# Patient Record
Sex: Female | Born: 1960 | Race: White | Hispanic: No | Marital: Single | Smoking: Former smoker
Health system: Southern US, Community
[De-identification: ages and names within clinical notes are randomized; demographics above are authoritative.]

## PROBLEM LIST (undated history)

## (undated) DIAGNOSIS — G4700 Insomnia, unspecified: Secondary | ICD-10-CM

## (undated) DIAGNOSIS — F419 Anxiety disorder, unspecified: Secondary | ICD-10-CM

## (undated) DIAGNOSIS — I10 Essential (primary) hypertension: Secondary | ICD-10-CM

## (undated) DIAGNOSIS — T148XXA Other injury of unspecified body region, initial encounter: Secondary | ICD-10-CM

## (undated) HISTORY — PX: OTHER SURGICAL HISTORY: SHX169

---

## 2004-09-24 ENCOUNTER — Ambulatory Visit: Payer: Self-pay | Admitting: Internal Medicine

## 2004-09-29 ENCOUNTER — Ambulatory Visit: Payer: Self-pay | Admitting: Internal Medicine

## 2004-10-25 ENCOUNTER — Ambulatory Visit: Payer: Self-pay | Admitting: Internal Medicine

## 2006-06-15 ENCOUNTER — Ambulatory Visit: Payer: Self-pay | Admitting: Family Medicine

## 2011-08-01 ENCOUNTER — Ambulatory Visit: Payer: Self-pay | Admitting: Orthopaedic Surgery

## 2011-08-01 ENCOUNTER — Inpatient Hospital Stay: Payer: Self-pay | Admitting: Internal Medicine

## 2011-08-01 LAB — URINALYSIS, COMPLETE
Bilirubin,UR: NEGATIVE
Glucose,UR: NEGATIVE mg/dL (ref 0–75)
Hyaline Cast: 6
Ketone: NEGATIVE
Nitrite: NEGATIVE
Protein: NEGATIVE
Specific Gravity: 1.013 (ref 1.003–1.030)
Squamous Epithelial: 1

## 2011-08-01 LAB — CBC
HGB: 12.7 g/dL (ref 12.0–16.0)
MCH: 35.3 pg — ABNORMAL HIGH (ref 26.0–34.0)
MCHC: 34.1 g/dL (ref 32.0–36.0)
MCV: 104 fL — ABNORMAL HIGH (ref 80–100)
Platelet: 107 10*3/uL — ABNORMAL LOW (ref 150–440)
RBC: 3.59 10*6/uL — ABNORMAL LOW (ref 3.80–5.20)
WBC: 30.4 10*3/uL — ABNORMAL HIGH (ref 3.6–11.0)

## 2011-08-01 LAB — COMPREHENSIVE METABOLIC PANEL
Albumin: 1.5 g/dL — ABNORMAL LOW (ref 3.4–5.0)
Anion Gap: 13 (ref 7–16)
BUN: 148 mg/dL — ABNORMAL HIGH (ref 7–18)
Bilirubin,Total: 1.3 mg/dL — ABNORMAL HIGH (ref 0.2–1.0)
Calcium, Total: 8.5 mg/dL (ref 8.5–10.1)
Chloride: 101 mmol/L (ref 98–107)
Co2: 27 mmol/L (ref 21–32)
Creatinine: 1.83 mg/dL — ABNORMAL HIGH (ref 0.60–1.30)
EGFR (African American): 37 — ABNORMAL LOW
EGFR (Non-African Amer.): 31 — ABNORMAL LOW
Glucose: 229 mg/dL — ABNORMAL HIGH (ref 65–99)
Osmolality: 337 (ref 275–301)
SGOT(AST): 76 U/L — ABNORMAL HIGH (ref 15–37)
Sodium: 141 mmol/L (ref 136–145)
Total Protein: 6.6 g/dL (ref 6.4–8.2)

## 2011-08-01 LAB — DRUG SCREEN, URINE
Amphetamines, Ur Screen: NEGATIVE (ref ?–1000)
Barbiturates, Ur Screen: NEGATIVE (ref ?–200)
Benzodiazepine, Ur Scrn: NEGATIVE (ref ?–200)
MDMA (Ecstasy)Ur Screen: NEGATIVE (ref ?–500)
Methadone, Ur Screen: NEGATIVE (ref ?–300)
Opiate, Ur Screen: NEGATIVE (ref ?–300)
Phencyclidine (PCP) Ur S: NEGATIVE (ref ?–25)
Tricyclic, Ur Screen: NEGATIVE (ref ?–1000)

## 2011-08-01 LAB — CK TOTAL AND CKMB (NOT AT ARMC): CK, Total: 301 U/L — ABNORMAL HIGH (ref 21–215)

## 2011-08-02 LAB — CBC WITH DIFFERENTIAL/PLATELET
Eosinophil #: 0 10*3/uL (ref 0.0–0.7)
Eosinophil %: 0 %
HCT: 34.6 % — ABNORMAL LOW (ref 35.0–47.0)
HGB: 11.7 g/dL — ABNORMAL LOW (ref 12.0–16.0)
Lymphocyte #: 0.6 10*3/uL — ABNORMAL LOW (ref 1.0–3.6)
MCH: 35.5 pg — ABNORMAL HIGH (ref 26.0–34.0)
Monocyte #: 0.1 10*3/uL (ref 0.0–0.7)
Monocyte %: 0.5 %
Monocytes: 4 %
Neutrophil #: 26 10*3/uL — ABNORMAL HIGH (ref 1.4–6.5)
Platelet: 92 10*3/uL — ABNORMAL LOW (ref 150–440)
RBC: 3.31 10*6/uL — ABNORMAL LOW (ref 3.80–5.20)
Segmented Neutrophils: 70 %
WBC: 26.7 10*3/uL — ABNORMAL HIGH (ref 3.6–11.0)

## 2011-08-02 LAB — LIPID PANEL
Cholesterol: 118 mg/dL (ref 0–200)
HDL Cholesterol: 8 mg/dL — ABNORMAL LOW (ref 40–60)
Triglycerides: 300 mg/dL — ABNORMAL HIGH (ref 0–200)

## 2011-08-02 LAB — BASIC METABOLIC PANEL
Anion Gap: 12 (ref 7–16)
Anion Gap: 11 (ref 7–16)
BUN: 143 mg/dL — ABNORMAL HIGH (ref 7–18)
Chloride: 109 mmol/L — ABNORMAL HIGH (ref 98–107)
Co2: 26 mmol/L (ref 21–32)
Co2: 25 mmol/L (ref 21–32)
Creatinine: 1.69 mg/dL — ABNORMAL HIGH (ref 0.60–1.30)
EGFR (African American): 39 — ABNORMAL LOW
EGFR (African American): 41 — ABNORMAL LOW
EGFR (Non-African Amer.): 32 — ABNORMAL LOW
Glucose: 137 mg/dL — ABNORMAL HIGH (ref 65–99)
Osmolality: 330 (ref 275–301)
Potassium: 3.4 mmol/L — ABNORMAL LOW (ref 3.5–5.1)
Sodium: 147 mmol/L — ABNORMAL HIGH (ref 136–145)

## 2011-08-02 LAB — CK: CK, Total: 180 U/L (ref 21–215)

## 2011-08-02 LAB — MAGNESIUM: Magnesium: 3 mg/dL — ABNORMAL HIGH

## 2011-08-03 LAB — CBC WITH DIFFERENTIAL/PLATELET
Comment - H1-Com1: NORMAL
Comment - H1-Com2: NORMAL
Eosinophil #: 0 10*3/uL (ref 0.0–0.7)
Eosinophil %: 0 %
HCT: 24.1 % — ABNORMAL LOW (ref 35.0–47.0)
Lymphocytes: 2 %
MCHC: 34.3 g/dL (ref 32.0–36.0)
MCV: 104 fL — ABNORMAL HIGH (ref 80–100)
Monocyte #: 0.4 10*3/uL (ref 0.0–0.7)
Neutrophil #: 30.8 10*3/uL — ABNORMAL HIGH (ref 1.4–6.5)
Neutrophil %: 97.4 %
Platelet: 107 10*3/uL — ABNORMAL LOW (ref 150–440)
RBC: 2.31 10*6/uL — ABNORMAL LOW (ref 3.80–5.20)
Segmented Neutrophils: 87 %

## 2011-08-03 LAB — URINE CULTURE

## 2011-08-03 LAB — COMPREHENSIVE METABOLIC PANEL
Albumin: 1.2 g/dL — ABNORMAL LOW (ref 3.4–5.0)
Alkaline Phosphatase: 124 U/L (ref 50–136)
Anion Gap: 14 (ref 7–16)
BUN: 95 mg/dL — ABNORMAL HIGH (ref 7–18)
Bilirubin,Total: 0.9 mg/dL (ref 0.2–1.0)
Calcium, Total: 6.9 mg/dL — CL (ref 8.5–10.1)
Chloride: 109 mmol/L — ABNORMAL HIGH (ref 98–107)
Co2: 22 mmol/L (ref 21–32)
EGFR (African American): 49 — ABNORMAL LOW
Glucose: 140 mg/dL — ABNORMAL HIGH (ref 65–99)
Potassium: 4.2 mmol/L (ref 3.5–5.1)
SGOT(AST): 108 U/L — ABNORMAL HIGH (ref 15–37)
Sodium: 145 mmol/L (ref 136–145)
Total Protein: 5 g/dL — ABNORMAL LOW (ref 6.4–8.2)

## 2011-08-04 LAB — COMPREHENSIVE METABOLIC PANEL
Albumin: 1.2 g/dL — ABNORMAL LOW (ref 3.4–5.0)
Alkaline Phosphatase: 117 U/L (ref 50–136)
BUN: 48 mg/dL — ABNORMAL HIGH (ref 7–18)
Calcium, Total: 6.8 mg/dL — CL (ref 8.5–10.1)
Creatinine: 0.97 mg/dL (ref 0.60–1.30)
Glucose: 226 mg/dL — ABNORMAL HIGH (ref 65–99)
Osmolality: 316 (ref 275–301)
SGPT (ALT): 19 U/L
Sodium: 149 mmol/L — ABNORMAL HIGH (ref 136–145)
Total Protein: 5.4 g/dL — ABNORMAL LOW (ref 6.4–8.2)

## 2011-08-04 LAB — CBC WITH DIFFERENTIAL/PLATELET
Basophil #: 0 10*3/uL (ref 0.0–0.1)
Basophil %: 0 %
Eosinophil %: 0 %
HCT: 22.5 % — ABNORMAL LOW (ref 35.0–47.0)
HGB: 7.6 g/dL — ABNORMAL LOW (ref 12.0–16.0)
MCH: 35.3 pg — ABNORMAL HIGH (ref 26.0–34.0)
MCV: 104 fL — ABNORMAL HIGH (ref 80–100)
Platelet: 155 10*3/uL (ref 150–440)
RBC: 2.16 10*6/uL — ABNORMAL LOW (ref 3.80–5.20)
RDW: 15.2 % — ABNORMAL HIGH (ref 11.5–14.5)
WBC: 22 10*3/uL — ABNORMAL HIGH (ref 3.6–11.0)

## 2011-08-04 LAB — HEMOGLOBIN A1C

## 2011-08-04 LAB — OCCULT BLOOD X 1 CARD TO LAB, STOOL: Occult Blood, Feces: POSITIVE

## 2011-08-05 LAB — CBC WITH DIFFERENTIAL/PLATELET
Eosinophil #: 0 10*3/uL (ref 0.0–0.7)
HCT: 22.4 % — ABNORMAL LOW (ref 35.0–47.0)
HGB: 7.6 g/dL — ABNORMAL LOW (ref 12.0–16.0)
MCHC: 33.8 g/dL (ref 32.0–36.0)
MCV: 104 fL — ABNORMAL HIGH (ref 80–100)
Monocyte #: 0.4 x10 3/mm (ref 0.2–0.9)
Neutrophil %: 94.9 %
Platelet: 235 10*3/uL (ref 150–440)
RBC: 2.14 10*6/uL — ABNORMAL LOW (ref 3.80–5.20)
WBC: 26.5 10*3/uL — ABNORMAL HIGH (ref 3.6–11.0)

## 2011-08-05 LAB — FERRITIN: Ferritin (ARMC): 642 ng/mL — ABNORMAL HIGH (ref 8–388)

## 2011-08-05 LAB — HEMOGLOBIN A1C

## 2011-08-06 LAB — CBC WITH DIFFERENTIAL/PLATELET
Basophil #: 0 10*3/uL (ref 0.0–0.1)
Eosinophil #: 0.1 10*3/uL (ref 0.0–0.7)
HCT: 19.3 % — ABNORMAL LOW (ref 35.0–47.0)
HGB: 6.5 g/dL — ABNORMAL LOW (ref 12.0–16.0)
Lymphocyte #: 1 10*3/uL (ref 1.0–3.6)
Lymphocyte %: 3 %
MCHC: 33.7 g/dL (ref 32.0–36.0)
Monocyte #: 0.5 x10 3/mm (ref 0.2–0.9)
RDW: 14.5 % (ref 11.5–14.5)

## 2011-08-06 LAB — WOUND CULTURE

## 2011-08-06 LAB — IRON AND TIBC
Iron Bind.Cap.(Total): 209 ug/dL — ABNORMAL LOW (ref 250–450)
Iron: 42 ug/dL — ABNORMAL LOW (ref 50–170)
Unbound Iron-Bind.Cap.: 167 ug/dL

## 2011-08-07 LAB — BASIC METABOLIC PANEL
BUN: 10 mg/dL (ref 7–18)
Chloride: 104 mmol/L (ref 98–107)
Co2: 25 mmol/L (ref 21–32)
EGFR (Non-African Amer.): >60
Osmolality: 278 (ref 275–301)
Potassium: 3.6 mmol/L (ref 3.5–5.1)

## 2011-08-07 LAB — CBC WITH DIFFERENTIAL/PLATELET
Basophil #: 0 10*3/uL (ref 0.0–0.1)
Eosinophil %: 0.1 %
HGB: 10 g/dL — ABNORMAL LOW (ref 12.0–16.0)
Lymphocyte %: 3.3 %
MCH: 31.4 pg (ref 26.0–34.0)
MCHC: 32.2 g/dL (ref 32.0–36.0)
Monocyte #: 0.9 x10 3/mm (ref 0.2–0.9)
Neutrophil #: 30.7 10*3/uL — ABNORMAL HIGH (ref 1.4–6.5)
Neutrophil %: 93.8 %
Platelet: 419 10*3/uL (ref 150–440)
RBC: 3.19 10*6/uL — ABNORMAL LOW (ref 3.80–5.20)
RDW: 18.2 % — ABNORMAL HIGH (ref 11.5–14.5)

## 2011-08-08 LAB — CBC WITH DIFFERENTIAL/PLATELET
Basophil %: 0.3 %
Eosinophil #: 0 10*3/uL (ref 0.0–0.7)
Eosinophil %: 0.1 %
HCT: 23.7 % — ABNORMAL LOW (ref 35.0–47.0)
Lymphocyte #: 0.9 10*3/uL — ABNORMAL LOW (ref 1.0–3.6)
MCH: 31.5 pg (ref 26.0–34.0)
Monocyte #: 0.5 x10 3/mm (ref 0.2–0.9)
Neutrophil %: 93.3 %
Platelet: 396 10*3/uL (ref 150–440)
RBC: 2.4 10*6/uL — ABNORMAL LOW (ref 3.80–5.20)
WBC: 21.1 10*3/uL — ABNORMAL HIGH (ref 3.6–11.0)

## 2011-08-09 LAB — BASIC METABOLIC PANEL
Anion Gap: 12 (ref 7–16)
BUN: 8 mg/dL (ref 7–18)
Calcium, Total: 6.6 mg/dL — CL (ref 8.5–10.1)
Chloride: 106 mmol/L (ref 98–107)
Creatinine: 0.72 mg/dL (ref 0.60–1.30)
EGFR (African American): >60
EGFR (Non-African Amer.): >60
Glucose: 74 mg/dL (ref 65–99)
Osmolality: 276 (ref 275–301)
Sodium: 140 mmol/L (ref 136–145)

## 2011-08-09 LAB — CBC WITH DIFFERENTIAL/PLATELET
Basophil #: 0.1 10*3/uL (ref 0.0–0.1)
Eosinophil %: 0.1 %
HGB: 10.5 g/dL — ABNORMAL LOW (ref 12.0–16.0)
Lymphocyte #: 1.2 10*3/uL (ref 1.0–3.6)
Lymphocyte %: 4.6 %
MCH: 31.2 pg (ref 26.0–34.0)
MCHC: 32.2 g/dL (ref 32.0–36.0)
Monocyte #: 0.9 x10 3/mm (ref 0.2–0.9)
Neutrophil %: 91.7 %
RBC: 3.36 10*6/uL — ABNORMAL LOW (ref 3.80–5.20)

## 2011-08-10 LAB — CBC WITH DIFFERENTIAL/PLATELET
Basophil #: 0.1 10*3/uL (ref 0.0–0.1)
Basophil %: 0.7 %
Eosinophil #: 0 10*3/uL (ref 0.0–0.7)
Eosinophil %: 0.1 %
HCT: 29.4 % — ABNORMAL LOW (ref 35.0–47.0)
HGB: 9.4 g/dL — ABNORMAL LOW (ref 12.0–16.0)
Lymphocyte #: 1 10*3/uL (ref 1.0–3.6)
Lymphocyte %: 5.1 %
MCH: 30.7 pg (ref 26.0–34.0)
Monocyte #: 0.7 x10 3/mm (ref 0.2–0.9)
Neutrophil #: 17.2 10*3/uL — ABNORMAL HIGH (ref 1.4–6.5)
Neutrophil %: 90.6 %
RBC: 3.07 10*6/uL — ABNORMAL LOW (ref 3.80–5.20)
RDW: 16.3 % — ABNORMAL HIGH (ref 11.5–14.5)
WBC: 19 10*3/uL — ABNORMAL HIGH (ref 3.6–11.0)

## 2011-08-11 LAB — CBC WITH DIFFERENTIAL/PLATELET
Eosinophil %: 0.2 %
HGB: 9.7 g/dL — ABNORMAL LOW (ref 12.0–16.0)
Lymphocyte #: 1.2 10*3/uL (ref 1.0–3.6)
Lymphocyte %: 7.2 %
MCHC: 32.1 g/dL (ref 32.0–36.0)
MCV: 97 fL (ref 80–100)
Monocyte %: 4.9 %
Neutrophil #: 14.5 10*3/uL — ABNORMAL HIGH (ref 1.4–6.5)
Neutrophil %: 86.7 %
WBC: 16.8 10*3/uL — ABNORMAL HIGH (ref 3.6–11.0)

## 2011-08-11 LAB — POTASSIUM: Potassium: 3.2 mmol/L — ABNORMAL LOW (ref 3.5–5.1)

## 2011-08-11 LAB — WOUND CULTURE

## 2011-08-12 LAB — CBC WITH DIFFERENTIAL/PLATELET
Basophil #: 0.2 10*3/uL — ABNORMAL HIGH (ref 0.0–0.1)
Basophil %: 1.1 %
Eosinophil %: 0.4 %
Lymphocyte #: 1.2 10*3/uL (ref 1.0–3.6)
Lymphocyte %: 7.7 %
MCH: 30.4 pg (ref 26.0–34.0)
Monocyte #: 1 x10 3/mm — ABNORMAL HIGH (ref 0.2–0.9)
Monocyte %: 6.3 %
Neutrophil #: 12.7 10*3/uL — ABNORMAL HIGH (ref 1.4–6.5)
Neutrophil %: 84.5 %
Platelet: 625 10*3/uL — ABNORMAL HIGH (ref 150–440)
RBC: 3.57 10*6/uL — ABNORMAL LOW (ref 3.80–5.20)
RDW: 16.9 % — ABNORMAL HIGH (ref 11.5–14.5)
WBC: 15 10*3/uL — ABNORMAL HIGH (ref 3.6–11.0)

## 2011-08-13 LAB — CBC WITH DIFFERENTIAL/PLATELET
Basophil %: 0.6 %
HCT: 29.2 % — ABNORMAL LOW (ref 35.0–47.0)
HGB: 9.6 g/dL — ABNORMAL LOW (ref 12.0–16.0)
Lymphocyte #: 1.5 10*3/uL (ref 1.0–3.6)
MCHC: 32.7 g/dL (ref 32.0–36.0)
MCV: 96 fL (ref 80–100)
Monocyte %: 7.4 %
Neutrophil #: 11 10*3/uL — ABNORMAL HIGH (ref 1.4–6.5)
Neutrophil %: 80.2 %
Platelet: 520 10*3/uL — ABNORMAL HIGH (ref 150–440)
RDW: 17.1 % — ABNORMAL HIGH (ref 11.5–14.5)
WBC: 13.8 10*3/uL — ABNORMAL HIGH (ref 3.6–11.0)

## 2011-08-13 LAB — CREATININE, SERUM
Creatinine: 0.65 mg/dL (ref 0.60–1.30)
EGFR (African American): >60
EGFR (Non-African Amer.): >60

## 2011-08-13 LAB — CULTURE, BLOOD (SINGLE)

## 2011-08-14 LAB — BASIC METABOLIC PANEL
BUN: 6 mg/dL — ABNORMAL LOW (ref 7–18)
Calcium, Total: 7.2 mg/dL — ABNORMAL LOW (ref 8.5–10.1)
Chloride: 106 mmol/L (ref 98–107)
Co2: 26 mmol/L (ref 21–32)
Creatinine: 0.6 mg/dL (ref 0.60–1.30)
EGFR (African American): >60
Potassium: 3.6 mmol/L (ref 3.5–5.1)

## 2011-08-14 LAB — CBC WITH DIFFERENTIAL/PLATELET
Basophil #: 0.1 10*3/uL (ref 0.0–0.1)
Basophil %: 0.6 %
HCT: 25.5 % — ABNORMAL LOW (ref 35.0–47.0)
HGB: 8.1 g/dL — ABNORMAL LOW (ref 12.0–16.0)
Lymphocyte %: 10.5 %
MCV: 95 fL (ref 80–100)
Monocyte %: 5.5 %
Neutrophil %: 81.8 %
Platelet: 401 10*3/uL (ref 150–440)
RBC: 2.68 10*6/uL — ABNORMAL LOW (ref 3.80–5.20)
RDW: 17.2 % — ABNORMAL HIGH (ref 11.5–14.5)

## 2011-08-14 LAB — WOUND CULTURE

## 2011-08-15 LAB — CBC WITH DIFFERENTIAL/PLATELET
Eosinophil #: 0.2 10*3/uL (ref 0.0–0.7)
Eosinophil %: 1.4 %
HCT: 27.7 % — ABNORMAL LOW (ref 35.0–47.0)
HGB: 8.9 g/dL — ABNORMAL LOW (ref 12.0–16.0)
Lymphocyte #: 1.3 10*3/uL (ref 1.0–3.6)
MCH: 30.7 pg (ref 26.0–34.0)
MCHC: 32.2 g/dL (ref 32.0–36.0)
Monocyte #: 0.9 x10 3/mm (ref 0.2–0.9)
Neutrophil #: 9.1 10*3/uL — ABNORMAL HIGH (ref 1.4–6.5)
Neutrophil %: 78.6 %
Platelet: 377 10*3/uL (ref 150–440)
RBC: 2.91 10*6/uL — ABNORMAL LOW (ref 3.80–5.20)
RDW: 17.3 % — ABNORMAL HIGH (ref 11.5–14.5)

## 2011-08-17 LAB — CBC WITH DIFFERENTIAL/PLATELET
Basophil #: 0 10*3/uL (ref 0.0–0.1)
Basophil %: 0.4 %
Eosinophil #: 0.3 10*3/uL (ref 0.0–0.7)
HCT: 25.8 % — ABNORMAL LOW (ref 35.0–47.0)
Lymphocyte %: 16.8 %
MCH: 30.4 pg (ref 26.0–34.0)
MCHC: 32.6 g/dL (ref 32.0–36.0)
Monocyte #: 1.3 x10 3/mm — ABNORMAL HIGH (ref 0.2–0.9)
Neutrophil #: 8.9 10*3/uL — ABNORMAL HIGH (ref 1.4–6.5)
Neutrophil %: 70.4 %
Platelet: 376 10*3/uL (ref 150–440)
RBC: 2.77 10*6/uL — ABNORMAL LOW (ref 3.80–5.20)

## 2011-08-17 LAB — WOUND CULTURE

## 2011-08-17 LAB — BASIC METABOLIC PANEL
Anion Gap: 8 (ref 7–16)
BUN: 5 mg/dL — ABNORMAL LOW (ref 7–18)
Chloride: 103 mmol/L (ref 98–107)
Co2: 26 mmol/L (ref 21–32)
Creatinine: 0.59 mg/dL — ABNORMAL LOW (ref 0.60–1.30)
EGFR (African American): >60
EGFR (Non-African Amer.): >60
Glucose: 99 mg/dL (ref 65–99)
Potassium: 3.5 mmol/L (ref 3.5–5.1)

## 2011-08-19 LAB — CBC WITH DIFFERENTIAL/PLATELET
Basophil #: 0.1 10*3/uL (ref 0.0–0.1)
Basophil %: 0.3 %
Eosinophil %: 2.6 %
HCT: 28.7 % — ABNORMAL LOW (ref 35.0–47.0)
HGB: 9.2 g/dL — ABNORMAL LOW (ref 12.0–16.0)
Lymphocyte #: 2.5 10*3/uL (ref 1.0–3.6)
Lymphocyte %: 13.2 %
MCH: 30.1 pg (ref 26.0–34.0)
MCHC: 32.2 g/dL (ref 32.0–36.0)
Monocyte #: 1.6 x10 3/mm — ABNORMAL HIGH (ref 0.2–0.9)
Monocyte %: 8.4 %
Neutrophil #: 14.1 10*3/uL — ABNORMAL HIGH (ref 1.4–6.5)
Neutrophil %: 75.5 %
RDW: 17.7 % — ABNORMAL HIGH (ref 11.5–14.5)
WBC: 18.7 10*3/uL — ABNORMAL HIGH (ref 3.6–11.0)

## 2011-08-20 LAB — CBC WITH DIFFERENTIAL/PLATELET
Basophil #: 0 10*3/uL (ref 0.0–0.1)
Basophil %: 0.2 %
Eosinophil #: 0.5 10*3/uL (ref 0.0–0.7)
Eosinophil %: 2.4 %
Lymphocyte %: 13.7 %
MCH: 30.3 pg (ref 26.0–34.0)
MCHC: 32.7 g/dL (ref 32.0–36.0)
Monocyte #: 1.4 x10 3/mm — ABNORMAL HIGH (ref 0.2–0.9)
Neutrophil #: 14.9 10*3/uL — ABNORMAL HIGH (ref 1.4–6.5)
Neutrophil %: 76.5 %
Platelet: 531 10*3/uL — ABNORMAL HIGH (ref 150–440)
RBC: 2.71 10*6/uL — ABNORMAL LOW (ref 3.80–5.20)
RDW: 17.6 % — ABNORMAL HIGH (ref 11.5–14.5)

## 2011-08-23 LAB — CULTURE, FUNGUS WITHOUT SMEAR

## 2011-10-06 ENCOUNTER — Encounter: Payer: Self-pay | Admitting: Internal Medicine

## 2011-10-26 ENCOUNTER — Encounter: Payer: Self-pay | Admitting: Internal Medicine

## 2011-11-26 ENCOUNTER — Encounter: Payer: Self-pay | Admitting: Internal Medicine

## 2012-07-28 ENCOUNTER — Ambulatory Visit: Payer: Self-pay

## 2013-08-13 IMAGING — CR DG HAND COMPLETE 3+V*L*
1 series · 3 of 3 positions shown · non-contrast
Comparison: none

REASON FOR EXAM: pain
COMMENTS:

PROCEDURE:     DXR - DXR HAND LT COMPLETE  W/OBLIQUES  - August 01, 2011  [DATE]
RESULT:     Comparison: None.

[Series 1: pa · 0.17mm/px · 3 of 3 slices shown]
[im 1/3]
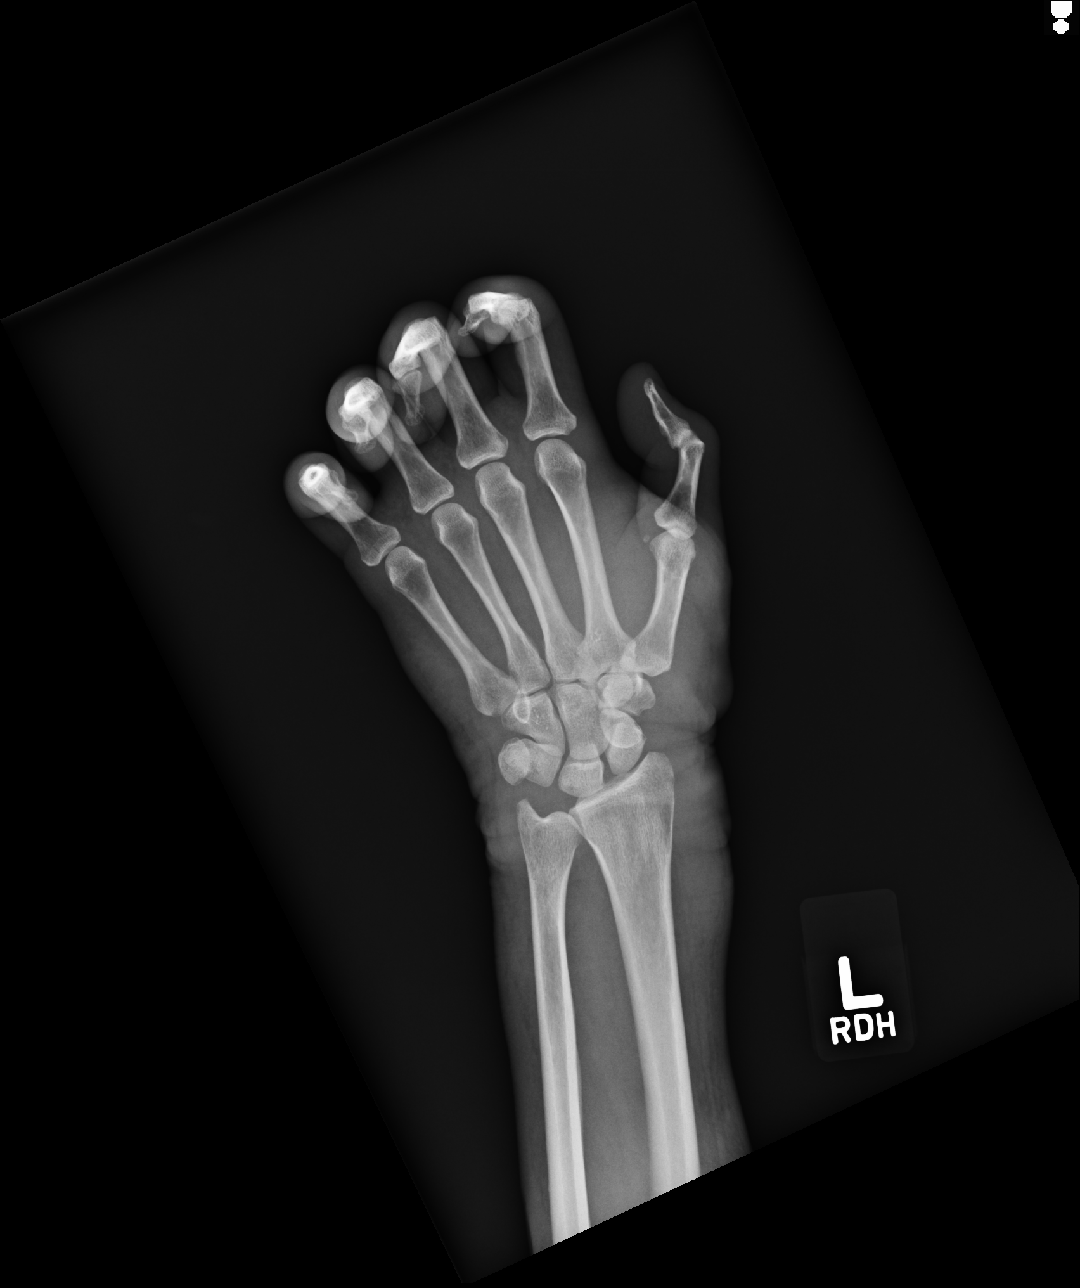
[im 2/3]
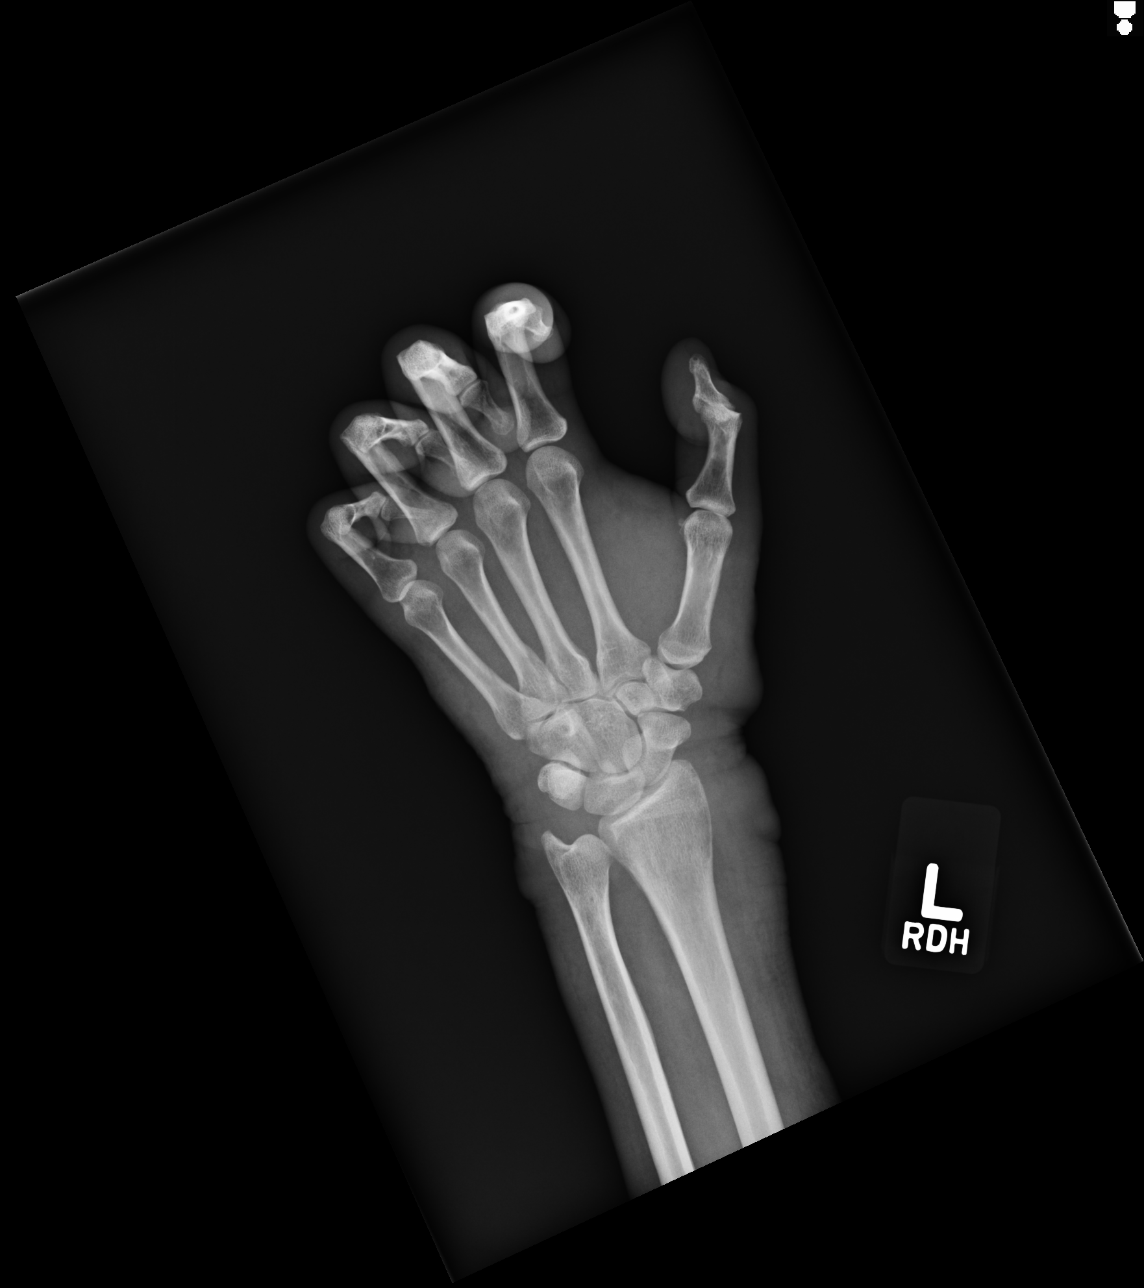
[im 3/3]
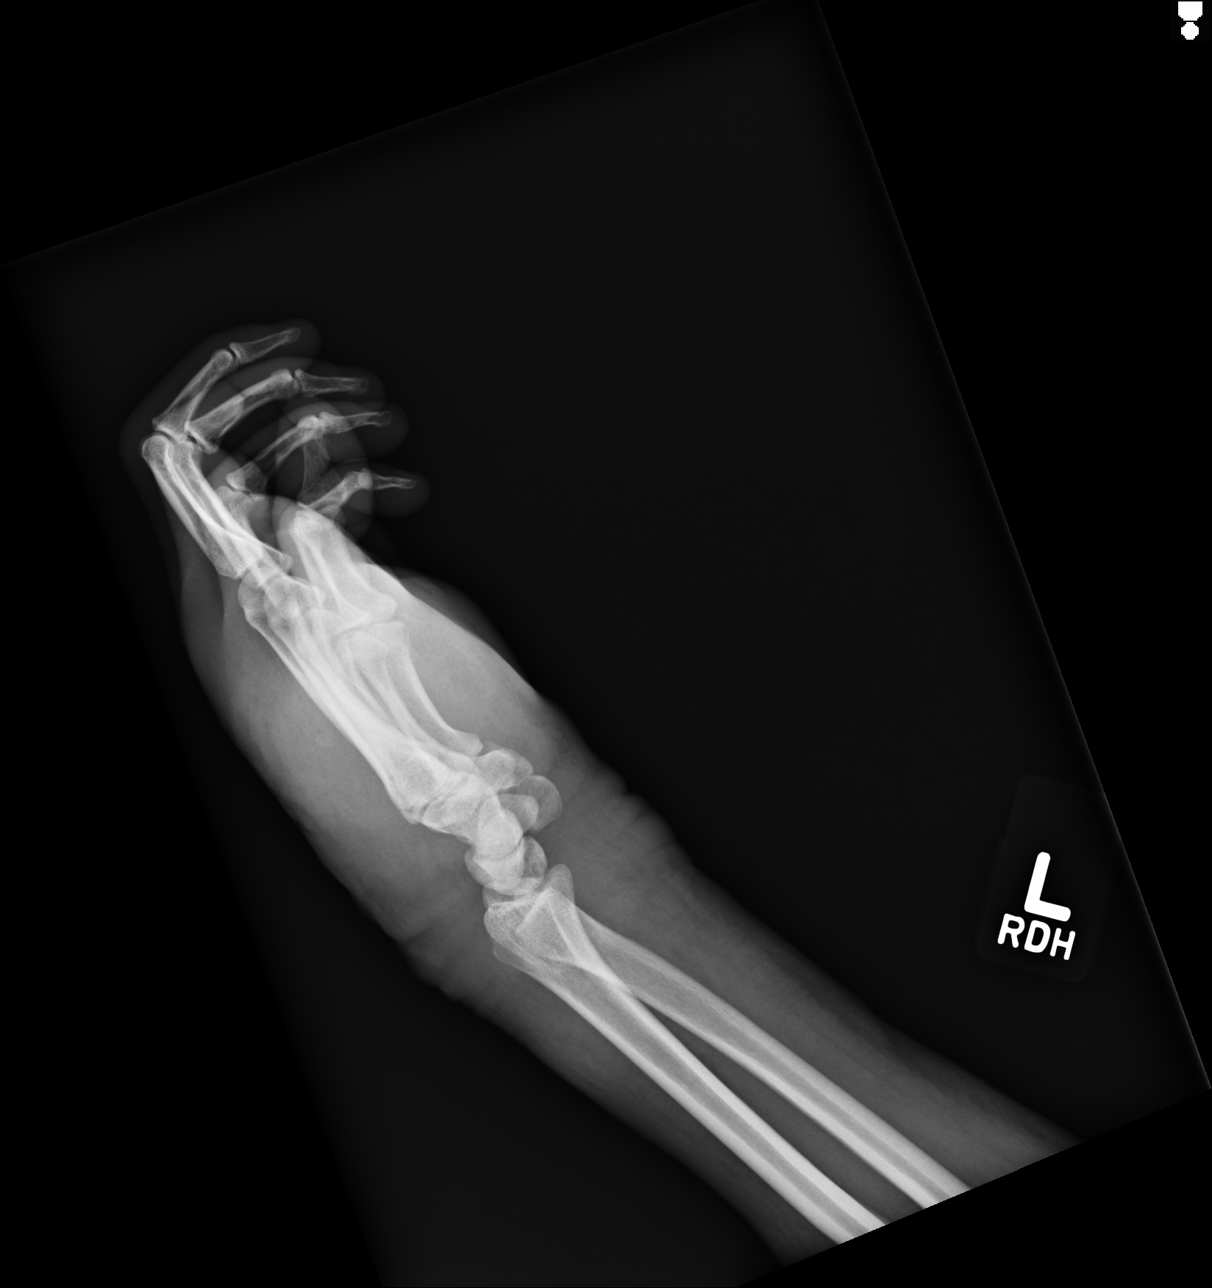

[3 of 3 positions shown; findings below may reference images not displayed]

FINDINGS: Evaluation is limited by persistent flexion of the fingers. There is diffuse
soft tissue swelling. No definite fracture seen.
IMPRESSION: Diffuse soft tissue swelling. No definite fracture seen.

## 2013-08-13 IMAGING — CT CT CERVICAL SPINE WITHOUT CONTRAST
1 series · 12 of 14 positions shown, 15 images · non-contrast
Comparison: none

REASON FOR EXAM: fall confusion  weak
COMMENTS:

PROCEDURE:     CT  - CT CERVICAL SPINE WO  - August 01, 2011  [DATE]
RESULT:     Comparison: None.
TECHNIQUE: Multiple axial CT images were obtained of the cervical spine,
without intravenous contrast.  Sagittal and coronal reformatted images were
constructed.

[Series 6: axial · axial · 0.33mm/px · z∈[-248,-116]mm · 12 of 78 slices shown, 15 images]
[im 6/78  soft-tissue]
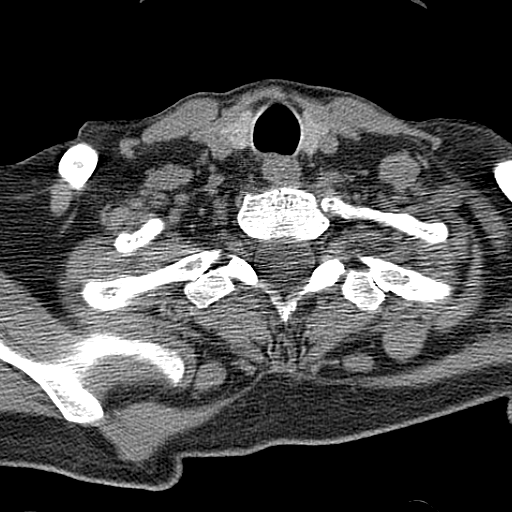
[im 6/78  bone]
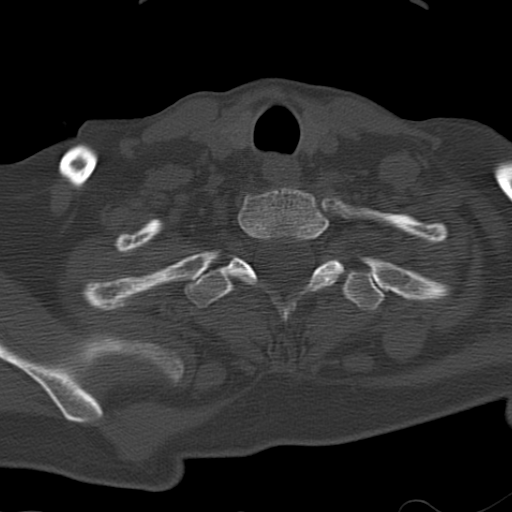
[im 12/78  bone]
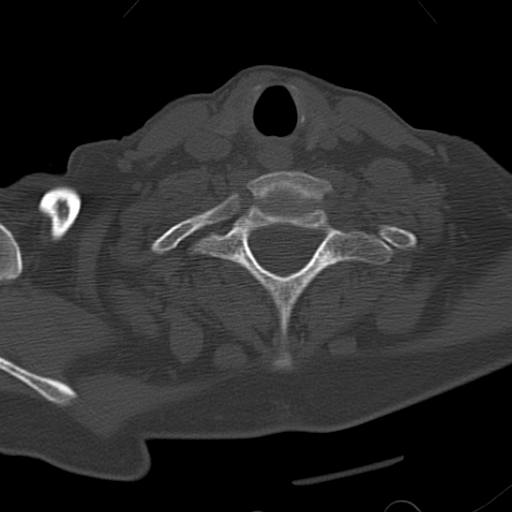
[im 18/78  bone]
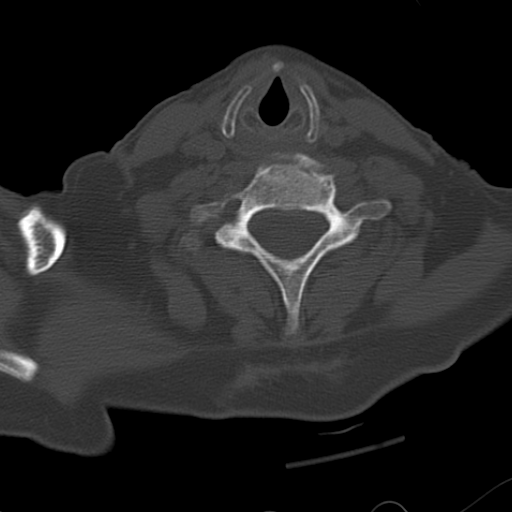
[im 24/78  bone]
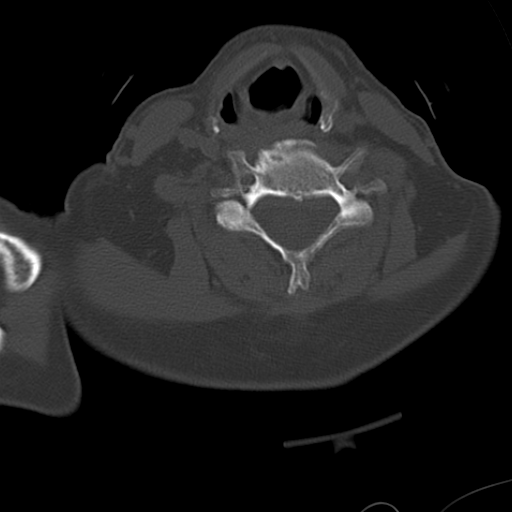
[im 30/78  soft-tissue]
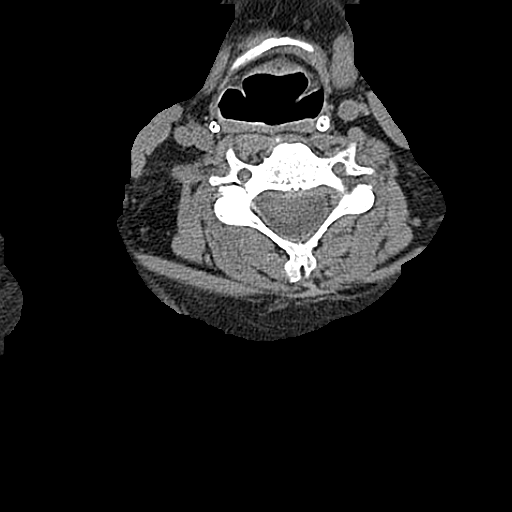
[im 30/78  bone]
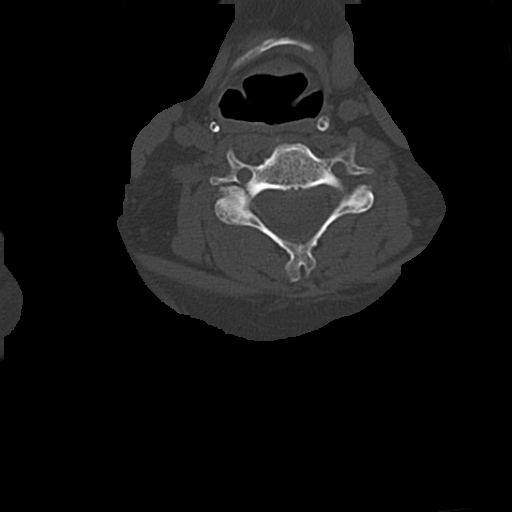
[im 36/78  bone]
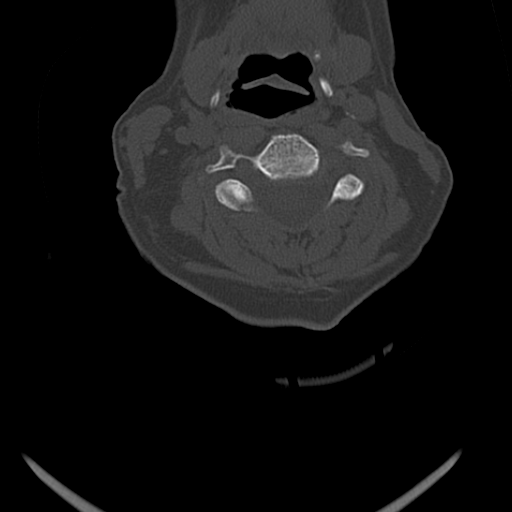
[im 42/78  bone]
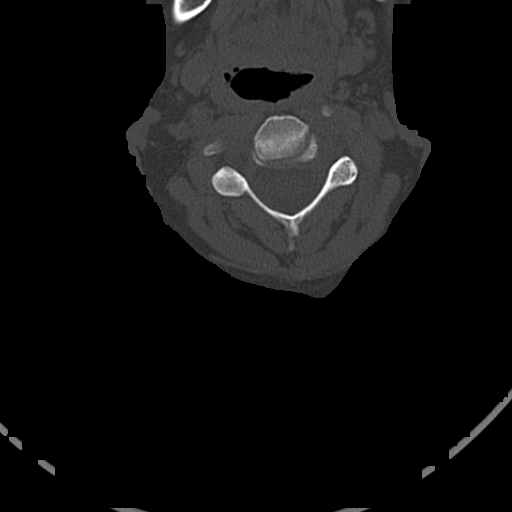
[im 48/78  bone]
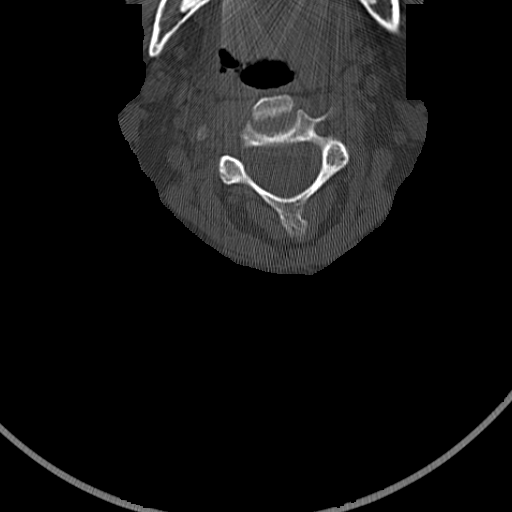
[im 54/78  soft-tissue]
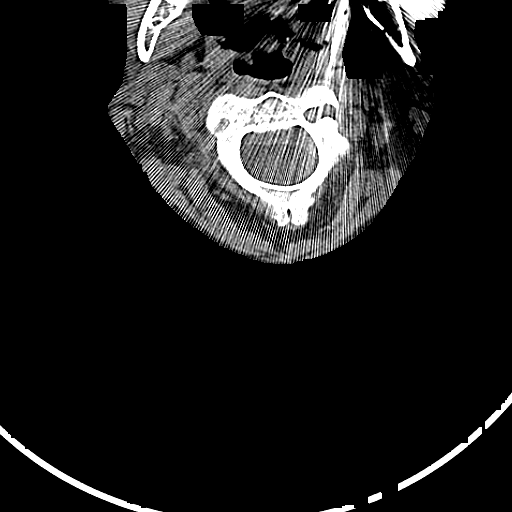
[im 54/78  bone]
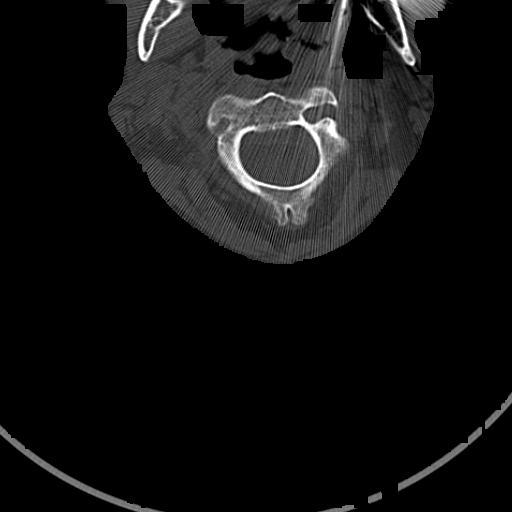
[im 60/78  bone]
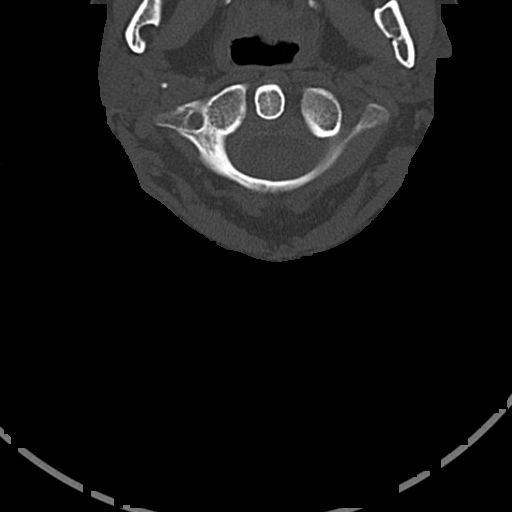
[im 66/78  bone]
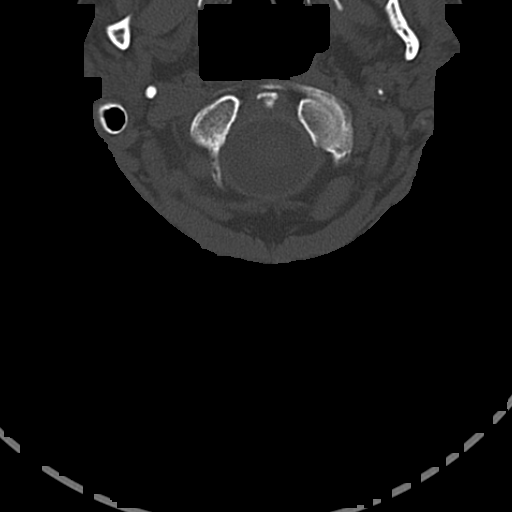
[im 72/78  bone]
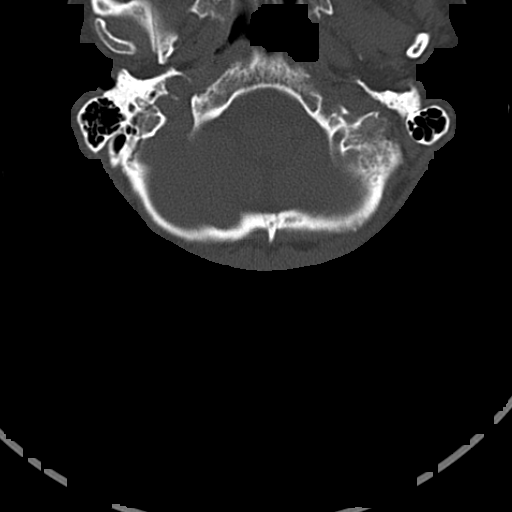

[12 of 14 positions shown; findings below may reference images not displayed]

FINDINGS: No evidence of cervical spine fracture or static listhesis.  Vertebral body
heights are maintained.  Prevertebral soft tissues are within normal limits.
There is degenerative disc disease at C5-C6. There is mild reversal of the
normal cervical lordosis, which is nonspecific.
IMPRESSION: No cervical spine fracture or static listhesis.  Ligamentous injury cannot
be excluded.

## 2014-01-24 ENCOUNTER — Inpatient Hospital Stay: Payer: Self-pay | Admitting: Internal Medicine

## 2014-01-24 LAB — DRUG SCREEN, URINE

## 2014-01-24 LAB — COMPREHENSIVE METABOLIC PANEL
ANION GAP: 15 (ref 7–16)
AST: 76 U/L — AB (ref 15–37)
Albumin: 3.4 g/dL (ref 3.4–5.0)
Alkaline Phosphatase: 184 U/L — ABNORMAL HIGH
BUN: 5 mg/dL — AB (ref 7–18)
Bilirubin,Total: 1.7 mg/dL — ABNORMAL HIGH (ref 0.2–1.0)
CO2: 26 mmol/L (ref 21–32)
CREATININE: 0.65 mg/dL (ref 0.60–1.30)
Calcium, Total: 8.6 mg/dL (ref 8.5–10.1)
Chloride: 80 mmol/L — ABNORMAL LOW (ref 98–107)
EGFR (Non-African Amer.): >60
GLUCOSE: 208 mg/dL — AB (ref 65–99)
Osmolality: 247 (ref 275–301)
POTASSIUM: 2.9 mmol/L — AB (ref 3.5–5.1)
SGPT (ALT): 38 U/L
Sodium: 121 mmol/L — ABNORMAL LOW (ref 136–145)
Total Protein: 6.6 g/dL (ref 6.4–8.2)

## 2014-01-24 LAB — URINALYSIS, COMPLETE
BILIRUBIN, UR: NEGATIVE
Blood: NEGATIVE
Glucose,UR: NEGATIVE mg/dL (ref 0–75)
NITRITE: NEGATIVE
PH: 7 (ref 4.5–8.0)
PROTEIN: NEGATIVE
SQUAMOUS EPITHELIAL: NONE SEEN
Specific Gravity: 1.034 (ref 1.003–1.030)

## 2014-01-24 LAB — CK: CK, TOTAL: 187 U/L

## 2014-01-24 LAB — CBC
HCT: 39.8 % (ref 35.0–47.0)
HGB: 13.4 g/dL (ref 12.0–16.0)
MCH: 35.2 pg — ABNORMAL HIGH (ref 26.0–34.0)
MCHC: 33.7 g/dL (ref 32.0–36.0)
MCV: 105 fL — ABNORMAL HIGH (ref 80–100)
PLATELETS: 358 10*3/uL (ref 150–440)
RBC: 3.81 10*6/uL (ref 3.80–5.20)
RDW: 16.3 % — ABNORMAL HIGH (ref 11.5–14.5)
WBC: 11.3 10*3/uL — AB (ref 3.6–11.0)

## 2014-01-24 LAB — ETHANOL: Ethanol: <3 mg/dL

## 2014-01-24 LAB — AMMONIA: Ammonia, Plasma: 36 umol/L — ABNORMAL HIGH

## 2014-01-24 LAB — TSH: THYROID STIMULATING HORM: 1.1 u[IU]/mL

## 2014-01-24 LAB — CK-MB: CK-MB: 44.7 ng/mL — AB (ref 0.5–3.6)

## 2014-01-24 LAB — TROPONIN I: TROPONIN-I: 4.7 ng/mL — AB

## 2014-01-25 DIAGNOSIS — R4182 Altered mental status, unspecified: Secondary | ICD-10-CM

## 2014-01-25 DIAGNOSIS — I361 Nonrheumatic tricuspid (valve) insufficiency: Secondary | ICD-10-CM

## 2014-01-25 DIAGNOSIS — R7989 Other specified abnormal findings of blood chemistry: Secondary | ICD-10-CM | POA: Diagnosis not present

## 2014-01-25 LAB — PROTIME-INR
INR: 0.9
Prothrombin Time: 12.5 secs (ref 11.5–14.7)

## 2014-01-25 LAB — TROPONIN I
TROPONIN-I: 2.9 ng/mL — AB
Troponin-I: 3.6 ng/mL — ABNORMAL HIGH

## 2014-01-25 LAB — CK TOTAL AND CKMB (NOT AT ARMC)
CK, Total: 85 U/L
CK-MB: 13.6 ng/mL — ABNORMAL HIGH (ref 0.5–3.6)

## 2014-01-25 LAB — HEPARIN LEVEL (UNFRACTIONATED): Anti-Xa(Unfractionated): 0.23 IU/mL — ABNORMAL LOW (ref 0.30–0.70)

## 2014-01-25 LAB — CO2, TOTAL: Co2: 33 mmol/L — ABNORMAL HIGH (ref 21–32)

## 2014-01-25 LAB — SODIUM, URINE, RANDOM: SODIUM, URINE RANDOM: 37 mmol/L (ref 20–110)

## 2014-01-25 LAB — CK-MB: CK-MB: 26.3 ng/mL — AB (ref 0.5–3.6)

## 2014-01-25 LAB — APTT: Activated PTT: 26.4 secs (ref 23.6–35.9)

## 2014-01-26 ENCOUNTER — Ambulatory Visit: Payer: Self-pay | Admitting: Neurology

## 2014-01-26 DIAGNOSIS — R7989 Other specified abnormal findings of blood chemistry: Secondary | ICD-10-CM | POA: Diagnosis not present

## 2014-01-26 LAB — CBC WITH DIFFERENTIAL/PLATELET
Basophil #: 0 10*3/uL (ref 0.0–0.1)
Basophil %: 0.5 %
EOS ABS: 0 10*3/uL (ref 0.0–0.7)
Eosinophil %: 0.3 %
HCT: 29.8 % — ABNORMAL LOW (ref 35.0–47.0)
HGB: 9.9 g/dL — ABNORMAL LOW (ref 12.0–16.0)
Lymphocyte #: 1.7 10*3/uL (ref 1.0–3.6)
Lymphocyte %: 16.8 %
MCH: 35.8 pg — AB (ref 26.0–34.0)
MCHC: 33.1 g/dL (ref 32.0–36.0)
MCV: 108 fL — ABNORMAL HIGH (ref 80–100)
Monocyte #: 0.6 x10 3/mm (ref 0.2–0.9)
Monocyte %: 5.7 %
Neutrophil #: 7.5 10*3/uL — ABNORMAL HIGH (ref 1.4–6.5)
Neutrophil %: 76.7 %
Platelet: 240 10*3/uL (ref 150–440)
RBC: 2.76 10*6/uL — AB (ref 3.80–5.20)
RDW: 15.5 % — ABNORMAL HIGH (ref 11.5–14.5)
WBC: 9.8 10*3/uL (ref 3.6–11.0)

## 2014-01-26 LAB — BASIC METABOLIC PANEL
ANION GAP: 6 — AB (ref 7–16)
BUN: 5 mg/dL — ABNORMAL LOW (ref 7–18)
CHLORIDE: 94 mmol/L — AB (ref 98–107)
CO2: 32 mmol/L (ref 21–32)
CREATININE: 0.52 mg/dL — AB (ref 0.60–1.30)
Calcium, Total: 7.7 mg/dL — ABNORMAL LOW (ref 8.5–10.1)
EGFR (Non-African Amer.): >60
Glucose: 83 mg/dL (ref 65–99)
Osmolality: 261 (ref 275–301)
Potassium: 2.7 mmol/L — ABNORMAL LOW (ref 3.5–5.1)
SODIUM: 132 mmol/L — AB (ref 136–145)

## 2014-01-26 LAB — HEPARIN LEVEL (UNFRACTIONATED)
ANTI-XA(UNFRACTIONATED): 0.41 [IU]/mL (ref 0.30–0.70)
Anti-Xa(Unfractionated): <0.1 IU/mL — ABNORMAL LOW (ref 0.30–0.70)

## 2014-01-27 DIAGNOSIS — R7989 Other specified abnormal findings of blood chemistry: Secondary | ICD-10-CM | POA: Diagnosis not present

## 2014-01-27 LAB — BASIC METABOLIC PANEL
Anion Gap: 8 (ref 7–16)
BUN: 3 mg/dL — AB (ref 7–18)
CALCIUM: 7.1 mg/dL — AB (ref 8.5–10.1)
Chloride: 109 mmol/L — ABNORMAL HIGH (ref 98–107)
Co2: 22 mmol/L (ref 21–32)
Creatinine: 0.45 mg/dL — ABNORMAL LOW (ref 0.60–1.30)
EGFR (African American): >60
EGFR (Non-African Amer.): >60
GLUCOSE: 83 mg/dL (ref 65–99)
OSMOLALITY: 273 (ref 275–301)
Potassium: 3.8 mmol/L (ref 3.5–5.1)
SODIUM: 139 mmol/L (ref 136–145)

## 2014-01-27 LAB — CBC WITH DIFFERENTIAL/PLATELET
Basophil #: 0 10*3/uL (ref 0.0–0.1)
Basophil %: 0.5 %
Eosinophil #: 0.1 10*3/uL (ref 0.0–0.7)
Eosinophil %: 0.9 %
HCT: 26.1 % — ABNORMAL LOW (ref 35.0–47.0)
HGB: 8.6 g/dL — ABNORMAL LOW (ref 12.0–16.0)
Lymphocyte #: 1.5 10*3/uL (ref 1.0–3.6)
Lymphocyte %: 18.5 %
MCH: 35.8 pg — AB (ref 26.0–34.0)
MCHC: 33.1 g/dL (ref 32.0–36.0)
MCV: 108 fL — ABNORMAL HIGH (ref 80–100)
Monocyte #: 0.4 x10 3/mm (ref 0.2–0.9)
Monocyte %: 5.3 %
Neutrophil #: 6.2 10*3/uL (ref 1.4–6.5)
Neutrophil %: 74.8 %
PLATELETS: 226 10*3/uL (ref 150–440)
RBC: 2.41 10*6/uL — ABNORMAL LOW (ref 3.80–5.20)
RDW: 15.5 % — AB (ref 11.5–14.5)
WBC: 8.3 10*3/uL (ref 3.6–11.0)

## 2014-01-27 LAB — URINE CULTURE

## 2014-01-27 LAB — HEPARIN LEVEL (UNFRACTIONATED): Anti-Xa(Unfractionated): 0.28 IU/mL — ABNORMAL LOW (ref 0.30–0.70)

## 2014-01-27 LAB — MAGNESIUM: Magnesium: 1.5 mg/dL — ABNORMAL LOW

## 2014-01-28 DIAGNOSIS — R7989 Other specified abnormal findings of blood chemistry: Secondary | ICD-10-CM | POA: Diagnosis not present

## 2014-01-28 LAB — MAGNESIUM: Magnesium: 1.6 mg/dL — ABNORMAL LOW

## 2014-01-28 LAB — HEMOGLOBIN: HGB: 8.8 g/dL — AB (ref 12.0–16.0)

## 2014-01-28 LAB — POTASSIUM: Potassium: 3.6 mmol/L (ref 3.5–5.1)

## 2014-01-29 ENCOUNTER — Telehealth: Payer: Self-pay

## 2014-01-29 NOTE — Telephone Encounter (Signed)
l mom for pt to schedule tcm/ph . Dr. Mariah MillingGollan primary cardiologist

## 2014-01-29 NOTE — Telephone Encounter (Signed)
Attempted to call pt to schedule hosp f/u. No answer 

## 2014-02-05 NOTE — Telephone Encounter (Signed)
Na to schedule tcm/ph

## 2014-08-18 NOTE — Consult Note (Signed)
PATIENT NAME:  Belinda Mcgee, [name] MR#:  [id number] DATE OF BIRTH:  Aug 26, 1960  DATE OF CONSULTATION:  01/26/2014  REFERRING PHYSICIAN:   CONSULTING PHYSICIAN:  Pauletta BrownsYuriy Reinhart Saulters, MD  REASON FOR CONSULTATION: Altered mental status.  HISTORY OF PRESENT ILLNESS: A 54 year old female with past medical history of alcohol abuse and hypertension admitted for altered mental status. The patient was found at home with multiple bottles of liquor around her and completely altered. Alcohol level was undetectable on admission. The patient was found to have hyponatremia with sodium 121 and potassium 2.9. UA was positive for urinary tract infection and was started on Rocephin. Current mental status is significantly improved. The patient follows commands and I think she is back to her baseline.   PAST MEDICAL HISTORY: Hypertension, hyperlipidemia, heavy EtOH use.  PAST SURGICAL HISTORY: Back surgery, deviated nasal septum.  ALLERGIES: No known drug allergies.   PHYSICAL EXAMINATION: The patient is able tell me the date, the time, her name and the reason why she is in the hospital. Facial sensation intact. Facial motor is intact. Tongue is midline. Uvula elevates symmetrically. Speech appears to be fluent. Motor strength 5/5 bilateral upper and lower extremities. Coordination: Finger-to-nose intact. Sensation intact to light touch and temperature. Reflexes 1+ and symmetrical.   IMPRESSION: A 54 year old female with history of EtOH use and hypertension admitted with altered mental status, found to be hypokalemic and hypokalemic. Status post replacement of electrolytes. Mental status significantly improved. Positive urinary tract infection, being treated on antibiotics. The patient is status post EEG, awaiting results.   PLAN: Status post MRI. We are waiting for those results at this point, but patient is currently back to baseline. Would not start any antiepileptic medication. Continue thiamine 500 mg IV for a total of 3  days and the patient can start taking 100 mg p.o. daily after that. Will also supplement with daily folate. No further input from a neurological standpoint. Thank you. It was a pleasure seeing this patient. Please call with any questions.  ____________________________ Pauletta BrownsYuriy Dakarri Kessinger, MD yz:sb D: 01/26/2014 12:56:00 ET T: 01/26/2014 13:20:32 ET JOB#: [id number]  cc: Pauletta BrownsYuriy Burnie Therien, MD, <Dictator> Pauletta BrownsYURIY Ligaya Cormier MD ELECTRONICALLY SIGNED 01/29/2014 14:26

## 2014-08-18 NOTE — Consult Note (Signed)
General Aspect Primary Cardiologist: New to [facility] ___________________________  54 year old female without any prior known cardiac history, but has reported history of heavy alcohol abuse and HTN who presents to [facility] today after calling EMS for altered mental status.  ___________________________  PMH: 1. HTN 2. Alcohol abuse _________________________   Present Illness 54 year old female with the above problem list who presents to [facility] today after calling EMS for altered mental status.  Apparently, upon EMS arrival there were multiple bottles of alcohol on scene when they arrived and the patient was found to be completely altered.   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   HPI taken from prior H & P 2\2 patient sedation. Patient was brought to [facility] ED along with her son, who rode along, and per ED nursing staff he was along acting intoxicated. Upon her arrival to [facility] ED labs were significant for TnI 4.70-->3.60-->2.90, Na 121, K+ 2.9, ammonia 36, UDS negative, ethanol <3, wbc 11.3, ABG pH 7.6, CO2 28, HCO3 28.1, urine positive for UTI. CTA of the chest revealed: No evidence of thoracic aortic aneurysm or dissection. No evidence of pulmonary embolism. No evidence of acute cardiopulmonary disease. Hepatic steatosis. CT of her head revealed: No evidence of parenchymal hemorrhage or extra-axial fluid collection. No mass lesion, mass effect, or midline shift. No CT evidence of acute infarction. Cerebral volume is within normal limits.  No ventriculomegaly. The visualized paranasal sinuses are essentially clear. The mastoid air cells are unopacified. No evidence of calvarial fracture. IMPRESSION: Normal head CT.  She received Rocephin in the ED and has been started on heparin gtt. Patient unable to answer any questions regarding what brought her to the ED or her history. She does open her eyes when asked. Her son is not  currently in the hospital with her to assess his status.   She has never been seen in [place].   Physical Exam:  GEN well developed, well nourished, altered   HEENT hearing intact to voice, moist oral mucosa   NECK supple   RESP normal resp effort  clear BS   CARD Regular rate and rhythm  No murmur   ABD denies tenderness  soft  normal BS   EXTR negative edema   SKIN normal to palpation   NEURO cranial nerves intact   PSYCH stuporous   Review of Systems:  ROS Pt not able to provide ROS   Medications/Allergies Reviewed Medications/Allergies reviewed   Family & Social History:  Family and Social History:  Family History Negative  unable to obtain 2/2 altered status   Social History unable to obtain 2/2 altered status   Place of Living Home     Hypercholesterolemia:    HTN:    Back Surgery:          Admit Diagnosis:   AMS: Onset Date: 25-Jan-2014, Status: Active, Description: AMS  Home Medications: Medication Instructions Status  gabapentin 400 mg oral capsule 1 cap(s) orally 2 times a day Active   Lab Results:  Hepatic:  30-Sep-15 19:05   Bilirubin, Total  1.7  Alkaline Phosphatase  184 (46-116 NOTE: New Reference Range 11/14/13)  SGPT (ALT) 38 (14-63 NOTE: New Reference Range 11/14/13)  SGOT (AST)  76  Total Protein, Serum 6.6  Albumin, Serum 3.4  Lab:  30-Sep-15 23:40   pH (ABG)  7.610 (7.350-7.450 NOTE: New Reference Range 11/18/13)  PCO2  28 (32-48  NOTE: New Reference Range 12/05/13)  PO2 92 (83-108 NOTE: New Reference Range 11/18/13)  FiO2 21  Base Excess  7 (-3-3 NOTE: New Reference Range 12/05/13)  HCO3  28.1 (21.0-28.0 NOTE: New Reference Range 11/18/13)  O2 Saturation 97.5  O2 Device Room Air  Specimen Site (ABG) RT RADIAL  Specimen Type (ABG) ARTERIAL  Patient Temp (ABG) 37.0  Routine Chem:  30-Sep-15 19:05   CO2, Serum 26  Result Comment troponin - RESULTS VERIFIED BY REPEAT TESTING.  - lee furgeson 01/24/14 _0  rdj.   - READ-BACK PROCESS PERFORMED.  Result(s) reported on 24 Jan 2014 at 07:52PM.  Ethanol, S. < 3 (Result(s) reported on 24 Jan 2014 at [facility].)  Glucose, Serum  208  BUN  5  Creatinine (comp) 0.65  Sodium, Serum  121  Potassium, Serum  2.9  Chloride, Serum  80  Calcium (Total), Serum 8.6  Osmolality (calc) 247  eGFR (African American) >60  eGFR (Non-African American) >60 (eGFR values <46m/min/1.73 m2 may be an indication of chronic kidney disease (CKD). Calculated eGFR, using the MRDR Study equation, is useful in  patients with stable renal function. The eGFR calculation will not be reliable in acutely ill patients when serum creatinine is changing rapidly. It is not useful in patients on dialysis. The eGFR calculation may not be applicable to patients at the low and high extremes of body sizes, pregnant women, and vetetarians.)  Anion Gap 15    22:11   Ammonia, Plasma  36 (Result(s) reported on 24 Jan 2014 at 10:44PM.)    23:40   Result Comment - READ-BACK PROCESS PERFORMED.  - HAND DELIVERED  - Dr. VLunette Stands@ [phone] 01/24/2014 jcg  Result(s) reported on 24 Jan 2014 at 11:50PM.  Urine Drugs:  [id number]  Tricyclic Antidepressant, Ur Qual (comp) NEGATIVE (Result(s) reported on 24 Jan 2014 at 10:28PM.)  Amphetamines, Urine Qual. NEGATIVE  MDMA, Urine Qual. NEGATIVE  Cocaine Metabolite, Urine Qual. NEGATIVE  Opiate, Urine qual NEGATIVE  Phencyclidine, Urine Qual. NEGATIVE  Cannabinoid, Urine Qual. NEGATIVE  Barbiturates, Urine Qual. NEGATIVE  Benzodiazepine, Urine Qual. NEGATIVE (----------------- The URINE DRUG SCREEN provides only a preliminary, unconfirmed analytical test result and should not be used for non-medical  purposes.  Clinical consideration and professional judgment should be  applied to any positive drug screen result due to possible interfering substances.  A more specific alternate chemical method must be used in order to obtain a confirmed analytical  result.  Gas chromatography/mass spectrometry (GC/MS) is the preferred confirmatory method.)  Methadone, Urine Qual. NEGATIVE  Cardiac:  30-Sep-15 19:05   Troponin I  4.70 (0.00-0.05 0.05 ng/mL or less: NEGATIVE  Repeat testing in 3-6 hrs  if clinically indicated. >0.05 ng/mL: POTENTIAL  MYOCARDIAL INJURY. Repeat  testing in 3-6 hrs if  clinically indicated. NOTE: An increase or decrease  of 30% or more on serial  testing suggests a  clinically important change)    19:06   CPK-MB, Serum  44.7 (Result(s) reported on 24 Jan 2014 at 08:43PM.)    23:58   CPK-MB, Serum  26.3 (Result(s) reported on 25 Jan 2014 at 01:17AM.)  01-Oct-15 01:10   Troponin I  3.60 (0.00-0.05 0.05 ng/mL or less: NEGATIVE  Repeat testing in 3-6 hrs  if clinically indicated. >0.05 ng/mL: POTENTIAL  MYOCARDIAL INJURY. Repeat  testing in 3-6 hrs if  clinically indicated. NOTE: An increase or decrease  of 30% or more on serial  testing suggests a  clinically important change)  05:37   CPK-MB, Serum  13.6 (Result(s) reported on 25 Jan 2014 at 09:07AM.)  Troponin I  2.90 (0.00-0.05 0.05 ng/mL or less: NEGATIVE  Repeat testing in 3-6 hrs  if clinically indicated. >0.05 ng/mL: POTENTIAL  MYOCARDIAL INJURY. Repeat  testing in 3-6 hrs if  clinically indicated. NOTE: An increase or decrease  of 30% or more on serial  testing suggests a  clinically important change)  Routine UA:  30-Sep-15 21:29   Color (UA) Yellow  Clarity (UA) Clear  Glucose (UA) Negative  Bilirubin (UA) Negative  Ketones (UA) 1+  Specific Gravity (UA) 1.034  Blood (UA) Negative  pH (UA) 7.0  Protein (UA) Negative  Nitrite (UA) Negative  Leukocyte Esterase (UA) 3+ (Result(s) reported on 24 Jan 2014 at 10:37PM.)  RBC (UA) 12 /HPF  WBC (UA) 262 /HPF  Bacteria (UA) TRACE  Epithelial Cells (UA) NONE SEEN  Mucous (UA) PRESENT (Result(s) reported on 24 Jan 2014 at 10:37PM.)  Routine Hem:  30-Sep-15 19:05   WBC (CBC)  11.3   RBC (CBC) 3.81  Hemoglobin (CBC) 13.4  Hematocrit (CBC) 39.8  Platelet Count (CBC) 358 (Result(s) reported on 24 Jan 2014 at 07:35PM.)  MCV  105  MCH  35.2  MCHC 33.7  RDW  16.3   EKG:  EKG Interp. by me   Interpretation EKG shows sinus tach, 121, slight up slopping st depression anterolateral leads   Radiology Results:  Cardiology:    01-Oct-15 12:46, Echo Doppler  Echo Doppler   REASON FOR EXAM:      COMMENTS:       PROCEDURE: [facility] - ECHO DOPPLER COMPLETE(TRANSTHOR)  - Jan 25 2014 12:46PM     RESULT: Echocardiogram Report    Patient Name:   Belinda Mcgee Date of Exam: 01/25/2014  Medical Rec #:  [id number]        Custom1:  Date ofBirth:  [birth year]      Height:       62.0 in  Patient Age:    17 years      Weight:       117.0 lb  Patient Gender: F             BSA:          1.52 m??    Indications: MI  Sonographer:    LTM  Referring Phys: Monica Becton    Sonographer Comments: Suboptimal windows    Summary:   1. Challenging image quality   2. Left ventricular ejection fraction, by visual estimation, is 60 to   65%.   3. Normal global left ventricular systolic function.   4. Impaired relaxation pattern of LV diastolic filling.   5. Mild left ventricular hypertrophy.   6. Normal right ventricular size and systolic function.   7. Mild tricuspid regurgitation.   8. Normal RVSP  2D AND M-MODE MEASUREMENTS (normal ranges within parentheses):  Left Ventricle:          Normal  AoV Cusp Separation: 1.80 cm (1.5-2.6)  IVSd (2D):      1.44 cm (0.7-1.1)  LVPWd (2D):     1.34 cm (0.7-1.1) Aorta/LA:                  Normal  LVIDd (2D):     3.33 cm (3.4-5.7) Aortic Root (2D): 2.60 cm (2.4-3.7)  LVIDs (2D):     2.77 cmAoV Cusp Exc:     1.80 cm (1.5-2.6)  LV FS (2D):     16.8 %   (>  25%)   Left Atrium (2D): 1.70 cm (1.9-4.0)  LV EF (2D):     36.2 %   (>50%)                                      Right Ventricle:                                    RVd (2D):  LV DIASTOLIC  FUNCTION:  MV Peak E: 0.64 m/s E/e' Ratio: 22.70  MV Peak A: 0.76 m/s Decel Time: 269 msec  E/A Ratio: 0.84  SPECTRAL DOPPLER ANALYSIS (where applicable):  Mitral Valve:  MV P1/2 Time: 78.01 msec  MV Area, PHT: 2.82 cm??  Aortic Valve: AoV Max Vel: 1.04 m/s AoV Peak PG: 4.3 mmHg AoV Mean PG:  LVOT Vmax: 0.65 m/s LVOT VTI:  LVOT Diameter: 1.50 cm  AoV Area, Vmax: 1.11 cm?? AoV Area, VTI:  AoV Area, Vmn:  Pulmonic Valve:  PV Max Velocity: 0.87 m/s PV Max PG: 3.0 mmHg PV Mean PG:    PHYSICIAN INTERPRETATION:  Left Ventricle: The left ventricular internal cavity size was normal.   Mild left ventricular hypertrophy. Global LV systolic function was   normal. Left ventricular ejection fraction, by visual estimation, is 60   to 65%. Spectral Doppler shows impaired relaxation pattern of LV   diastolic filling.  Right Ventricle: Normal right ventricular size, wall thickness, and   systolic function. The right ventricular size is normal. Global RV   systolic function is normal.  Left Atrium: The left atrium is normal in size.  Right Atrium: The right atrium is normal in size.  Pericardium: There is no evidence of pericardial effusion.  Mitral Valve: The mitral valve is normal in structure. Trace mitral valve   regurgitation is seen.  Tricuspid Valve: The tricuspid valve is normal. Mild tricuspid   regurgitation is visualized.  Aortic Valve: The aortic valve was not well seen. The aortic valve is   structurally normal, with no evidence of sclerosis or stenosis. No   evidence of aortic valve regurgitation is seen.  Pulmonic Valve: The pulmonic valve is normal. Trace pulmonic valve   regurgitation.  Aorta: The aortic root and ascending aorta are structurally normal, with   no evidence of dilitation.    [id number] Ida Rogue MD  Electronically signed by [id number] Ida Rogue MD  Signature Date/Time: 01/25/2014/2:36:04 PM    *** Final ***    IMPRESSION: .        Verified By: Minna Merritts, M.D., MD  CT:    30-Sep-15 20:11, CT Head Without Contrast  CT Head Without Contrast   REASON FOR EXAM:    altered mental status  COMMENTS:       PROCEDURE: CT  - CT HEAD WITHOUT CONTRAST  - Jan 24 2014  8:11PM     CLINICAL DATA:  Altered mental status    EXAM:  CT HEAD WITHOUT CONTRAST    TECHNIQUE:  Contiguous axial images were obtained from the base of the skull  through the vertex without intravenous contrast.    COMPARISON:  08/01/2011  FINDINGS:  No evidence of parenchymal hemorrhage or extra-axial fluid  collection. No mass lesion, mass effect, or midline shift.    No CT evidence of acute infarction.    Cerebral volume is within normal limits.  No ventriculomegaly.    The visualized paranasal sinuses are essentially clear. The mastoid  air cells are unopacified.    No evidence of calvarial fracture.     IMPRESSION:  Normal head CT.  Electronically Signed    By: Julian Hy M.D.    On: 01/24/2014 20:24         Verified By: Julian Hy, M.D.,    No Known Allergies:   Vital Signs/Nurse's Notes: **Vital Signs.:   01-Oct-15 10:58  Vital Signs Type Routine  Temperature Temperature (F) 97.9  Celsius 36.6  Temperature Source oral  Pulse Pulse 100  Respirations Respirations 18  Systolic BP Systolic BP 161  Diastolic BP (mmHg) Diastolic BP (mmHg) 80  Mean BP 90  Pulse Ox % Pulse Ox % 97  Pulse Ox Activity Level  At rest  Oxygen Delivery Room Air/ 21 %    Impression 54 year old female without any prior known cardiac history, but has reported history of heavy alcohol abuse and HTN who presents to [facility] today after calling EMS for altered mental status.   1. Elevated TnI: -4.7-->3.6-->2.9  suggesting event occured 24 to 48 hours ago, not acutely -Echo showing normal EF, no wall motion abn -Continue heparin gtt at this time  -On b-blocker -Add aspirin and statin, check FLP in AM -Given her current altered status unlikely to go for  cardiac cath at this time COnsider other causes of troponin elevation as well, unable to exclude hypoxia event, now with encephalopathy  2. Altered mental status: -Apparently found with numerous alcohol bottles at home by EMS - ethanol negative, UDS negative -Son is acting intoxicated as well per reports - will check carboxyhemoglobin level -Na 121 upon admission - monitor -Normal head CT - consider MRI/MRA of the brain -CTA chest with no evidence of thoracic aortic aneurysm or dissection. No evidence of pulmonary embolism. No evidence of acute cardiopulmonary disease. -Would not hesitate to get Neurology on board -Continue neuro checks  3. Hypokalemia: -2.9 upon admission -Being repleted via IV  4. History of alcohol abuse: -CIWA protocol   5. UTI: -Per IM -Cultures pending -Has received Rocephin   Electronic Signatures: Rise Mu (PA-C)  (Signed 01-Oct-15 12:55)  Authored: General Aspect/Present Illness, History and Physical Exam, Review of System, Family & Social History, Past Medical History, Home Medications, Labs, EKG , Allergies, Vital Signs/Nurse's Notes, Impression/Plan Ida Rogue (MD)  (Signed 01-Oct-15 17:40)  Authored: General Aspect/Present Illness, History and Physical Exam, Family & Social History, Health Issues, Labs, EKG , Radiology, Vital Signs/Nurse's Notes, Impression/Plan  Co-Signer: General Aspect/Present Illness, Home Medications, Allergies   Last Updated: 01-Oct-15 17:40 by Ida Rogue (MD)

## 2014-08-18 NOTE — Discharge Summary (Signed)
PATIENT NAME:  Belinda Mcgee, Belinda Mcgee MR#:  [id number] DATE OF BIRTH:  May 08, 1960  PRIMARY CARE PHYSICIAN:  Selina CooleyArthur Axelbank, MD  FINAL DIAGNOSES: 1.  Acute toxic metabolic encephalopathy.  2.  Acute myocardial infarction.  3.  Anemia.  4.  Hypomagnesemia.  5.  Hypokalemia.   MEDICATIONS ON DISCHARGE: Include atorvastatin 40 mg daily, aspirin 325 mg daily, ferrous sulfate 325 mg twice a day, magnesium oxide 400 mg twice a day.   DIET: Low sodium diet, regular consistency.   ACTIVITY: As tolerated.   FOLLOWUP: With Dr. Mariah MillingGollan in cardiology in 1 week; 1-2 weeks with Dr. Selina CooleyArthur Axelbank.   HOSPITAL COURSE:  Came in with altered mental status, suspected alcohol withdrawal. Also had hyponatremia, elevated troponin, urinary tract infection, was started on Rocephin. Hypokalemia, was given IV fluid, potassium.   LABORATORY AND RADIOLOGICAL DATA DURING THE HOSPITAL COURSE: Included ethanol level less than 3. Glucose 208, BUN 5, creatinine 0.65, sodium 121, potassium 2.9, chloride 80, CO2 of 26, calcium 8.6, total bilirubin 1.7, alkaline phosphatase 184, ALT 38, AST 76. White blood cell count 11.3, H and H, 13.4 and 39.8, platelet count of 358,000. Troponin 4.7.   EKG: Sinus tachycardia, left atrial enlargement. CT scan of the chest: No evidence of thoracic aortic aneurysm or dissection. No evidence of pulmonary embolism, hepatic steatosis. CT scan of the head negative. Urine culture grew out coagulase-negative staphylococcus. Urine toxicology negative. Urinalysis 3+ leukocyte esterase, ammonia 36. ABG showed a pH of 7.61, pCO2 of 28, pO2 of 92, that was on room air. Another urine culture grew out coagulase-negative staphylococcus. Troponin 3.6. Next troponin 2.9, carboxyhemoglobin 1.8%.   Echocardiogram showed an ejection fraction of 60%-65%, impaired relaxation, LVH.    Hemoglobin upon discharge 8.8, magnesium 1.6, potassium 3.6.   EEG showed normal awake, sleep EEG would no indication of epilepsy.    HOSPITAL COURSE PER PROBLEM LIST:  1.  For the patient's acute toxic metabolic encephalopathy, unclear etiology. Could be alcohol withdrawal. The patient was awake when I saw her on the 3rd and the 4th and was well enough to go home. Workup was negative. The patient's ethanol level when she came in was negative. Could be alcohol withdrawal, but I am not sure; either way patient improved well enough to go home with home health.  2.  Acute myocardial infarction, could be because she was lying down for a while. Blood pressure too low for beta blocker, not giving Plavix secondary to anemia. I am only giving aspirin and statin.  3.  Anemia. We will give ferrous sulfate, likely secondary from dilutional from being on heparin drip and IV fluids during the hospital course. Ferrous sulfate started.  4.  Hypomagnesemia and hypokalemia, replaced those orally during the hospital course and magnesium upon discharge.  TIME SPENT ON DISCHARGE: 35 minutes.    ____________________________ Herschell Dimesichard J. Renae GlossWieting, MD rjw:LT D: 01/29/2014 14:26:29 ET T: 01/29/2014 16:48:10 ET JOB#: [id number]  cc: Herschell Dimesichard J. Renae GlossWieting, MD, <Dictator> Selina CooleyArthur Axelbank, MD Antonieta Ibaimothy J. Gollan, MD  Salley ScarletICHARD J Shellye Zandi MD ELECTRONICALLY SIGNED 02/11/2014 12:47

## 2014-08-18 NOTE — H&P (Signed)
PATIENT NAME:  Belinda Mcgee, Belinda Mcgee MR#:  [id number] DATE OF BIRTH:  13-May-1960  DATE OF ADMISSION:  01/25/2014  PRIMARY CARE PHYSICIAN: Nonlocal.   REFERRING PHYSICIAN: Gladstone Pihavid Schaevitz, MD   CHIEF COMPLAINT: Altered mental status.   HISTORY OF PRESENT ILLNESS: Belinda Mcgee is a 54 year old female with a history of heavy alcohol abuse, hypertension, called EMS for altered mental status. When EMS arrived, the patient was found to have multiple bottles of liquor around her and was found to be completely altered mental status. The patient's son was at home who, per nursing staff in the ER, also seemed to be intoxicated. When patient arrived, Emergency Department, the patient could not answer any questions. Alcohol level was undetectable. Unable to obtain any history from the patient. The patient is found to have multiple lab abnormalities, including low sodium of 121, potassium of 2.9. The patient is also found to have elevated troponin of 4.7, CK-MB of 44. UA is strongly positive for urinary tract infection. Urine drug screen is negative. Per nursing staff, the patient also complained of left leg pain. The patient had admission in 2013 and had severe sepsis with Pasteurella canis and severe cellulitis of the arm. The patient received Rocephin in the Emergency Department, was given IV fluids, was also started on heparin drip.   PAST MEDICAL HISTORY:  1.  Hypertension.  2.  Hyperlipidemia.  3.  Heavy alcohol abuse.   PAST SURGICAL HISTORY: 1.  Back surgery. 2.  Deviated nasal septum.  ALLERGIES: No known drug allergies.   HOME MEDICATIONS: Gabapentin 400 mg 1 tablet 2 times a day.   SOCIAL HISTORY, FAMILY HISTORY, REVIEW OF SYSTEMS: Could not be obtained from the patient secondary to altered mental status.   PHYSICAL EXAMINATION:  GENERAL: This is a chronically ill-looking, disheveled female lying down in the bed, not in distress.  VITAL SIGNS: Temperature 97.7, pulse 115, blood pressure 137/101,  respiratory rate of 16, oxygen saturation is 98% on room air.  HEENT: Head normocephalic, atraumatic. There is no scleral icterus. Conjunctivae normal. Pupils equal and react to light. Mucous membranes are dry.  NECK: Supple. No lymphadenopathy. No JVD. No carotid bruit.  CHEST: No focal tenderness. Bilaterally clear to auscultation.  HEART: S1 and S2 regular. No murmurs are heard. 1 to 2+ pitting edema around the ankles.  ABDOMEN: Bowel sounds plus. Soft, nontender, nondistended. No hepatosplenomegaly. NEUROLOGIC: The patient is not oriented to place, person, and time. Was able to tell the name. Unable to give any answers and did not follow any commands. Could not examine the motor and sensory or cranial nerves.  SKIN: No rash or lesions.   LABORATORIES: Troponin 4.70. CBC: WBC of 11.3, MCV 105. Rest of all the values are within normal limits.   Sodium 121, potassium 2.9. TSH 1.10. UA: 3+ leukocyte esterase, WBC 262.   ASSESSMENT AND PLAN: Belinda Mcgee is a 54 year old female with a known history of heavy alcohol abuse who comes with altered mental status, is found to have multiple abnormalities.  1.  Altered mental status, alcohol withdrawal, hyponatremia. Admit the patient to a monitored bed. We will continue to follow up. The patient had improvement with the mental status from the time of the admission to Emergency Department.  2.  Hyponatremia secondary to alcohol abuse; however, check the urine sodium. Continue with intravenous fluids.  3.  Elevated troponins. Consult cardiology in the morning. Keep the patient on heparin drip. Also keep the patient on metoprolol.  4.  Alcohol abuse.  Keep the patient on thiamine. Keep the patient on alcohol withdrawal protocol.  5.  Urinary tract infection. Follow up with the urine cultures. Continue with Rocephin.  6.  Hypokalemia. Replace through intravenous fluids.  7.  The patient will be on heparin drip which should provide deep venous thrombosis  prophylaxis.   TIME SPENT: 60 minutes.    ____________________________ Susa Griffins, MD pv:ST D: 01/25/2014 00:45:05 ET T: 01/25/2014 01:21:36 ET JOB#: [id number]  cc: Susa Griffins, MD, <Dictator> Susa Griffins MD ELECTRONICALLY SIGNED 01/26/2014 22:59

## 2014-08-19 NOTE — Op Note (Signed)
PATIENT NAME:  Belinda Mcgee, Belinda Mcgee MR#:  [id number] DATE OF BIRTH:  [birth year]  DATE OF PROCEDURE:  08/13/2011  PREOPERATIVE DIAGNOSIS: Left thigh abscess.   POSTOPERATIVE DIAGNOSIS: Left thigh abscess.      PROCEDURE: Incision and drainage of left thigh abscess.   SURGEON: Leitha SchullerMichael J. Lenae Wherley, M.D.   ANESTHESIA: General.   DESCRIPTION OF PROCEDURE: The patient was brought to the Operating Room and after adequate anesthesia was obtained, the leg was prepped and draped in the usual sterile fashion drape free. After appropriate patient identification and time-out procedures were completed, an approximately 5 cm incision was made over the lateral thigh and the mid thigh. The incision was carried down through the skin and subcutaneous fat. At that point, there was a large amount of purulent fluid that came out of the wound. This was thoroughly irrigated with pulsatile lavage and on probing, there was another larger abscess slightly more proximal. Some necrotic fat was debrided with the use of a rongeur. The skin was left alone. The IT band was opened and there was no apparent deeper extension of the infection. The entire abscess then was irrigated with pulsatile lavage using 3 liters of antibiotic irrigation and 3 liters of saline. After thorough irrigation, deep drain was placed and the wound closed with 3-0 nylon. A compressive dressing was applied with Xeroform, 4 x 4's, ABD and tape. The patient was sent to the recovery room in stable condition. A culture was obtained. There were no complications. Specimen was cultured.   ____________________________ Leitha SchullerMichael J. Kendra Woolford, MD mjm:ap D: 08/13/2011 20:39:04 ET T: 08/14/2011 08:05:44 ET JOB#: [id number]  cc: Leitha SchullerMichael J. Dannel Rafter, MD, <Dictator> Leitha SchullerMICHAEL J Joellyn Grandt MD ELECTRONICALLY SIGNED 08/14/2011 12:20

## 2014-08-19 NOTE — Op Note (Signed)
PATIENT NAME:  Belinda Mcgee, Belinda Mcgee MR#:  [id number] DATE OF BIRTH:  [birth year]  DATE OF PROCEDURE:  08/07/2011  PREOPERATIVE DIAGNOSIS:  1. Abscess of the left hand and forearm.  2. Right prepatellar abscess.   POSTOPERATIVE DIAGNOSIS:  1. Abscess of the left hand and forearm.  2. Right prepatellar abscess.   PROCEDURE PERFORMED:  1. Irrigation and debridement of the left hand and forearm.  2. Application of wound VAC to the left hand and forearm.  3. Incision, irrigation, and debridement of the right prepatellar bursa.   SURGEON: Illene Labrador. Angie Fava., MD   ANESTHESIA: General.   ESTIMATED BLOOD LOSS: Minimal.   TOURNIQUET TIME: 38 minutes to the left arm; 32 minutes to the right leg.   DRAINS:  1. Wound VAC to the left forearm.  2. Medium Hemovac to the right knee.   INDICATIONS FOR SURGERY: The patient is a 54 year old female who presented to the hospital approximately six days ago with gross abscess formation to the left hand and forearm. She previously underwent incision, irrigation, and debridement of the left hand and forearm as per Dr. Ortencia Kick. A wound VAC was placed at that time. Subsequent cultures demonstrated Pasteurella Canis. Despite the use of appropriate antibiotics, the patient's white count remained elevated. She was also noted to have prepatellar swelling, erythema, and increased warmth over the course of the last several days. After discussion of the risks and benefits of surgical intervention, the patient expressed her understanding of the risks and benefits and agreed with plans for surgical intervention.  PROCEDURE IN DETAIL: The patient was brought into the Operating Room, and after adequate general anesthesia was achieved, a tourniquet was placed on the patient's left upper arm and right upper leg. The previously placed wound VAC was removed from the left hand and forearm. A "timeout" was performed as per usual protocol. The left hand and arm were cleaned and  prepped with Betadine. The sites were scrubbed with brushes using dilute Betadine solution. Inspection of the lesions to the left hand and forearm demonstrated maceration of the skin in between the open lesions. The dead skin was sharply debrided. The wound beds were debrided using tenotomy scissors. Lesions of both the volar and dorsal aspect of the forearm were then irrigated with copious amounts of normal saline with antibiotic solution. No new accumulation of purulent material was encountered. Necrotic tissue along the margins of the incisions were sharply debrided. Adaptic was placed along the open lesions so as to protect the surrounding tissue. A large wound VAC sponge was placed along the dorsum of the forearm, and a small wound VAC sponge was placed on the volar surface.  A secure seal was achieved using appropriate Vi-Drapes. A suction drain was applied to the volar and dorsal surfaces and hooked up to the wound VAC. Good seal was achieved. The tourniquet was deflated after a total tourniquet time of 38 minutes.   Attention was then directed to the right knee. The patient's right knee and leg were cleaned and prepped with Betadine solution and draped in the usual sterile fashion. A "timeout" was performed as per usual protocol. An anterior longitudinal incision was made. Large amounts of purulent material was expressed from the prepatellar bursa. Swabs were submitted for Gram stain, cultures, and sensitivities. The extent of the excision measured approximately 10 cm with the knee in full extension. There did not appear to be effusion to the knee. The prepatellar bursa was then inspected, and necrotic debris was sharply  excised using Metzenbaum scissors. Further debridement was performed using a VersaJet lavage. The wound was then irrigated with a total of 6 liters of antibiotic solution. A Hemovac drain was placed in the wound bed and brought out through a separate stab incision. The skin edges were  loosely approximated using subcutaneous sutures of #0 Monocryl. The skin was closed with skin staples. Sterile dressing was applied.   The patient tolerated the procedure well. She was transported to the recovery room in stable condition.  ____________________________ Illene LabradorJames P. Angie FavaHooten Jr., MD jph:cbb D: 08/07/2011 23:51:04 ET T: 08/08/2011 15:32:03 ET JOB#: [id number]  cc: Fayrene FearingJames P. Angie FavaHooten Jr., MD, <Dictator> JAMES P Angie FavaHOOTEN JR MD ELECTRONICALLY SIGNED 08/09/2011 20:25

## 2014-08-19 NOTE — Discharge Summary (Signed)
PATIENT NAME:  Belinda Mcgee, Belinda Mcgee MR#:  [id number] DATE OF BIRTH:  June 22, 1960  DATE OF ADMISSION:  08/01/2011 DATE OF DISCHARGE:  08/20/2011  Please see interim discharge summary dictated by Dr. Cherlynn Kaiser on 04/19. Please see interim discharge summary dictated by Dr. Winona Legato on 08/07/2011.   FINAL DIAGNOSES:  1. Sepsis with Pasteurella canis.  2. Left hand and wrist cellulitis and abscess requiring operating room drainage.  3. Right knee septic joint.  4. Left thigh abscess requiring drainage.  5. Acute renal failure, which improved.  6. Leukocytosis, persistent.  7. Hypotension.  8. Alcohol withdrawal.  9. Anemia.  10. Hypokalemia.  11. Weakness.   MEDICATIONS ON DISCHARGE:  1. Augmentin 875 mg p.o. twice a day for 15 more doses, filled by the hospital here.  2. Senokot 1 tablet p.o. twice a day as needed for constipation.  3. Multivitamin 1 tablet p.o. daily.  4. Ferrous sulfate 325 mg p.o. twice a day.  5. Ultram 50 mg every six hours as needed for pain. Dressing care for drainage of the wound, alginate, change every other day. Cover with Keflex wrap. For abrasions cover with DuoDERM heels and buttocks, change every 5 to 7 days.   DIET: Regular diet.   ACTIVITY: Activity as tolerated.   REFERRAL: Home Health, home PT, and home OT.    FOLLOWUP:  1. The patient has a follow-up appointment with Dr. Ernest Pine of orthopedics on Friday May 03 at 8:00 a.m.  2. Follow up at the [facility] on 05/07 at 5 p.m.    HOSPITAL COURSE:  The rest of this discharge summary will cover the hospital course from 08/15/2011 to 08/20/2011. During these days the patient's main issue was trying to get stronger and ambulate with very little function of her left arm and hand, and try to ambulate status post drainage of the right knee and left thigh abscess. The patient was deemed ready to go as per physical therapy with home physical therapy and home occupational therapy. Close clinical followup will be  needed. Care manager got the okay from DSS to discharge home. The patient's cat is going to be staying at a friend's house. The patient's Augmentin will be two weeks after the last drainage, which was done on 04/18, and so 15 more doses. As per Dr. Elenor Legato note, the patient may end up needing hand and wrist tendon reconstruction or further surgery in the future once this infection heals. Currently the wounds on the left wrist are open and will close by secondary intention.   White count on discharge 19.5, hemoglobin on discharge 8.2, platelet count upon discharge 531. Last chemistry showed a glucose of 99, BUN 5, creatinine 0.59, sodium 137, potassium 3.5, chloride 103, CO2 26, calcium 8.2. Stool for C. difficile on the 20th was negative.   For the patient's sepsis with  Pasteurella canis, the left hand/ wrist cellulitis and abscess, right knee septic joint, and left thigh abscess, the patient was seen by Dr. Leavy Cella and antibiotics were adjusted by him. Initially placed on Zosyn and vancomycin and metronidazole, and then fine-tuned to Zosyn, and then eventually switched over to Augmentin and will continue Augmentin for a total of 15 doses two weeks after the last drainage of abscess. The patient has follow-up appointments with Dr. Ernest Pine and the [facility] for close clinical monitoring. Since the white count is elevated, will need a follow-up white count on follow-up appointments. Again, please see interim summaries dictated by Dr. Cherlynn Kaiser and Dr.  Vaickute for the rest of the hospital course.  TIME SPENT ON DISCHARGE TODAY: 40 minutes.  ____________________________ Herschell Dimesichard J. Renae GlossWieting, MD rjw:bjt D:  08/20/2011 17:26:30 ET          T: 08/21/2011 11:15:22 ET         JOB#: [id number]  cc: [facility] Salley ScarletICHARD J Ajah Vanhoose MD ELECTRONICALLY SIGNED 08/23/2011 13:56

## 2014-08-19 NOTE — Consult Note (Signed)
PATIENT NAME:  Belinda Mcgee, Belinda Mcgee MR#:  [id number] DATE OF BIRTH:  [birth year]  DATE OF CONSULTATION:  08/01/2011  REFERRING PHYSICIAN:   CONSULTING PHYSICIAN:  Collier Bullock. Keishon Chavarin, MD  HISTORY OF PRESENT ILLNESS: This is a 54 year old female with a difficult to interpret story of falling down on the floor of her home approximately three days ago. She states at that time three days ago she noticed that her left hand had become exceptionally swollen, painful, red, and blistered. Per her report, neither her nor her son who lives with her and had been present at that time felt the need to pursue medical treatment until today. It is still not clear how she came to present to the Emergency Department for evaluation today. She states that during that three-day period she was unable to move from the floor, but cannot elaborate on the cause for the inability to move.  She had several other suspicious scrapes, bruises, and marks over her trunk and all four extremities.   PAST MEDICAL HISTORY:  The patient is somewhat disoriented and unable to give a complete medical history, but does believe that she takes blood pressure medicine and antianxiety medications.  SOCIAL HISTORY:  The patient denies use of alcohol, illegal drugs, and smoking.  She states that she lives at home with her son.  FAMILY HISTORY:  The patient is unable to give lucid answer when asked about her family history.  PHYSICAL EXAMINATION:  As mentioned previously, the patient has multiple bruises, scrapes, and abrasions about her head, chest, back, bilateral upper extremities, and bilateral lower extremities. She is tender to palpation over her bilateral hips, her right knee, and her right ankle. She is able to move all major joints with no crepitus or excessive pain on range of motion, the exception being her left wrist.  Exam of the left upper extremity demonstrates exceptionally advanced cellulitis appearance and significant swelling from just  proximal to the carpal tunnel through the distal extent of the digits. There is blistering of the skin and weeping of serous fluid.  The digits do appear perfused, but slightly sluggish cap refill. I am unable to locate or palpate a radial pulse due to the exceptional nature of her swelling. Forearm is not excessively swollen and is not tense. Her compartments are soft.  Her sensation is intact with respect to the radial, ulnar, and median nerve distributions of her hand. Motor function is severely limited due to the pain and swelling of the hand and fingers. The fingers are held in a position of moderate flexion due to the swelling in her fingers, but she is able to fire the AIN and PIN on exam.  On exam I am unable to assess for her ulnar nerve function in her third digits due to the excessive  nature of the swelling. She does have pain with extension of her fingers; however, this does not radiate proximally in her forearm. It appears to be more related to the swelling in her fingers. There do not appear to be signs consistent with ongoing compartment syndrome. I thoroughly have discussed with the Emergency Room physician and with the medicine team that swelling to this degree associated with cellulitis pattern over a three-day period may have already contributed to some muscle necrosis in her hand.   X-RAY FINDINGS: X-rays of the left hand, left forearm, bilateral knees, AP pelvis, and right ankle demonstrate no significant bony trauma and/or dislocations. Specific to the left upper extremity, there is soft tissue swelling  shadow as to be expected given the description of her swelling.  There is no noted gas or free air within the soft tissues on these plain film x-rays.  ASSESSMENT: 54 year old female with a complex and unclear story leading to her current situation with a predominant problem of left upper extremity swelling and apparent uncontrolled cellulitis.    PLAN:  The patient will start empiric  antibiotic therapy at the discretion of the internal medicine team. She has been instructed to elevate her left upper extremity at all times. We will plan for an MRI with contrast as soon as her renal function will allow.  If her renal function remains impaired and she is unable to obtain an MRI with contrast, we have asked to proceed with MRI without contrast to further asses the possibility of soft tissue fluid collections within her hand and wrist.  If a localized fluid collection is detected, we would then pursue operative debridement in addition to her empiric antibiotic therapy. We will continue to monitor the patient. We have asked that she be n.p.o. after midnight in case she requires a surgical procedure tomorrow.    ____________________________ Collier Bullockharles V. Umaima Scholten, MD cvs:bjt D: 08/01/2011 21:13:40 ET T: 08/03/2011 10:36:35 ET JOB#: [id number]  cc: Collier Bullockharles V. Yvan Dority, MD, <Dictator> Otilio SaberHARLES V Rio Kidane MD ELECTRONICALLY SIGNED 08/06/2011 21:27

## 2014-08-19 NOTE — Consult Note (Signed)
Brief Consult Note: Diagnosis: left hand swelling/cellulitis.   Patient was seen by consultant.   Consult note dictated.   Recommend further assessment or treatment.   Discussed with Attending MD.   Comments: middle age female with story of falling down in her floor 3 days ago and at that point noticing her L wrist was very swollen.  she has been unable to move from the floor over the past three days but can not articulate why or how she ended up here today.  multiple other scrapes and bruises with tenderness over both hips, R knee and R ankle.  Focused exam of LUE is exceptionally advanced cellulitis and swelling from just proximal to caral tunel through the digits.  blistering of the skin and weeping. digits appear perfused with slightly sluggish cap refill, unable to locate pulse due to excessive swelling sensation intact to radial/ulnar/median n dist motor function severely limited due to pain and swelling of the hand and fingers, with fingers held in position of moderate flexion due to swelling but able to fire AIN/PIN/unable to asses ulnar nerve function distally due to restricted rom in the digits  plans start empiric abx therapy elevate LUE at all times MRI in am with contrast if renal function will allow, if unable to tolerate contrast please procede with MRI without contrast. if localized fluid collection detected, would then persue debridement please keep NPO after midnight tonight.  Electronic Signatures: Otilio SaberSikes, Jaree Dwight V (MD)  (Signed 06-Apr-13 19:12)  Authored: Brief Consult Note   Last Updated: 06-Apr-13 19:12 by Otilio SaberSikes, Cory Rama V (MD)

## 2014-08-19 NOTE — Consult Note (Signed)
Brief Consult Note: Diagnosis: left hand infection.   Recommend further assessment or treatment.   Comments: sheduled for MRI of l hand/forearm today to eval for drainable abscess.  will need to be without contrast due to elevated Cr  exam of the hand is unchanged with continued significant swelling but intact sensation distally in all digits and limited but intact motor function for finger flexion and finger extension.  fingers still have perfusion but mildly sluggish cap refill.  margins of erythema and blistering have not changed significantly from marks make on 4/6 pm.  rec: strict elevation, cont IV abx, MRI will fu on MRI results NPO after breakfast in case needs debridement later today.  Electronic Signatures: Otilio SaberSikes, Javonna Balli V (MD)  (Signed 07-Apr-13 09:33)  Authored: Brief Consult Note   Last Updated: 07-Apr-13 09:33 by Otilio SaberSikes, Kedra Mcglade V (MD)

## 2014-08-19 NOTE — H&P (Signed)
PATIENT NAME:  Belinda Mcgee, Belinda Mcgee MR#:  [id number] DATE OF BIRTH:  Sep 25, 1960  DATE OF ADMISSION:  08/01/2011  REFERRING PHYSICIAN: Joseph Art, MD  PRIMARY CARE PHYSICIAN: Tillman Abide, MD  CHIEF COMPLAINT: Left upper extremity swelling.   HISTORY OF PRESENT ILLNESS: The patient is a 54 year old female with past medical history of hypertension and hyperlipidemia who stopped taking her medications about a year ago. She was brought in by EMS after being found face down on the floor. She reports that she fell three days ago and laid on the floor for three days and was unable to get up because her hips and legs were hurting. The patient denies having syncope or loss of consciousness. The patient's son lives with her and is the one who called EMS. The patient was found to be in severe dehydration, hyperkalemia, and possible compartment syndrome in her left upper extremity.   ALLERGIES: No known drug allergies.   PAST MEDICAL HISTORY:  1. Hypertension.  2. Hyperlipidemia.   PAST SURGICAL HISTORY:  1. Back surgery. 2. Deviated nasal septum.   MEDICATIONS: The patient has not taken any medications for the last one year.   FAMILY HISTORY: Both parents had hypertension, and hyperlipidemia.   SOCIAL HISTORY: Denies any history of smoking, alcohol, or drug abuse. Her son lives with her.   REVIEW OF SYSTEMS: CONSTITUTIONAL: Reports fatigue and weakness. EYES: Denies any blurred or double vision. ENT: Denies any tinnitus or ear pain. RESPIRATORY: Denies any cough or painful respirations. CARDIOVASCULAR: Denies any chest pain or palpitations. GI: Denies any nausea, vomiting, diarrhea, or abdominal pain. GU: Denies any dysuria or hematuria. ENDOCRINE: Denies any polyuria or nocturia. HEME/LYMPH: Denies any anemia or easy bruisability. INTEGUMENTARY: Denies acne or rash. MUSCULOSKELETAL: Reports pain in her left upper extremity. NEUROLOGICAL: Denies any numbness or weakness. PSYCH: Denies any history of  anxiety or depression.   PHYSICAL EXAMINATION:   VITAL SIGNS: Temperature 100.3, heart rate 108, respiratory rate 18, blood pressure 207/97, and pulse oximetry 100% on room air.   GENERAL: The patient is a thin built, Caucasian female lying in bed. She is very dehydrated, slow to respond, and unkempt.   HEAD: Atraumatic, normocephalic.   EYES: There is some pallor. No icterus or cyanosis. Pupils are equal, round, and reactive to light and accommodation. Extraocular movements intact.   ENT: Very dry mucous membranes, dry lips. Poor dental hygiene. No oropharyngeal erythema or thrush.   NECK: Supple. No masses. No JVD, thyromegaly, or lymphadenopathy.   CHEST WALL: There is some tenderness to palpation on her sternum and left ribs. Not using accessory muscles of respiration. No intercostal muscle retractions.   LUNGS: Bilaterally clear to auscultation. No wheezing, rales, or rhonchi.   CARDIOVASCULAR: S1 and S2 regular. No murmurs, rubs, or gallops.   ABDOMEN: Soft, nontender, and nondistended. No guarding or rigidity. No organomegaly. Hypoactive bowel sounds.  SKIN: The patient has multiple areas of ecchymosis. She also has multiple areas of erythema, especially on her knees and feet from where she laid on the floor. She has severe swelling in her left hand with ecchymosis and blistering. Radial pulse could not be palpated. The patient is keeping all her fingers in a flexed position. She has very minimal movement in her fingers.   PERIPHERIES: There is some pedal edema, 1+ pedal pulses.   MUSCULOSKELETAL: No cyanosis or clubbing. Additional examination as above.  NEUROLOGIC: The patient is slow to respond, but awake, alert, and oriented x3. She has generalized weakness. Cranial  nerves are grossly intact.   PSYCH: Flat mood and affect.  RESULTS: CAT scan of the spine shows no fracture or listhesis.   CAT scan of the abdomen and pelvis shows minimally displaced left anterior sixth  rib fracture, minimally displaced sternal fractures, multiple areas of muscle injury and hematoma, and indeterminate pulmonary nodules.   Glucose 229, BUN 148, creatinine 1.83, sodium 141, potassium 2.9, chloride 101, CO2 27, calcium 8.5, and AST 76. White count 13.4, hemoglobin 12.7, and hematocrit 7.2, and platelets 107. CK total 301. Normal cardiac enzymes negative.   UDS is negative.   ASSESSMENT AND PLAN: A 54 year old female who presents with left upper extremity swelling and pain after she fell down and laid on the floor for three days.  1. Sepsis with fever, tachycardia, and leukocytosis: We will obtain blood and urine cultures and start on empiric antibiotics.  2. Left upper extremity cellulitis, possible compartment syndrome: The patient has no radial pulse palpable in her left upper extremity. She was evaluated by Dr. Verlon SettingSikes from orthopedic surgery. For the time being, he recommends elevation and empiric antibiotics. Once the patient's dehydration and renal function is improved, he recommends doing a MRI of the left upper extremity, from her hand all the way to her elbow, with contrast. He will continue to follow the patient during the hospitalization. As per Dr. Verlon SettingSikes, the patient does not require any acute intervention at present. He expects some improvement once her swelling is better.  3. Accelerated hypertension: The patient reports that she has not taken any medications in the last year. We will start her on Norvasc, Lopressor, and p.r.n. hydralazine. We will adjust medications as needed to achieve good hypertensive control.  4. Multiple contusions, left anterior sixth rib fracture and minimally displaced sternal fracture. We will start on p.r.n. analgesics to help with pain control.  5. Indeterminate pulmonary nodules: The patient will require a CAT scan of the chest in 3 to 6 weeks to ensure resolution/stability. 6. Hyperglycemia: The patient denies any history of diabetes, however, her  finger glucose is 229. We will place her on an insulin sliding scale and diabetic diet and check a hemoglobin A1c.  7. Severe dehydration and acute renal failure: The patient's BUN is 148 and creatinine 1.83. We will give her 2 liters of fluid bolus and continue fluid at 125 mcg/hour. 8. Hypokalemia with a potassium of 2.9: We will replace p.o. and IV.  9. Elevated AST, possibly due to acute muscle injury: We will monitor.  10. Thrombocytopenia: We will monitor closely.  11. We will place on GI and DVT prophylaxis.   I discussed with the ED physician, Dr. Verlon SettingSikes, and the patient the plan of care and management.   TIME SPENT: 75 minutes.  ____________________________ Darrick MeigsSangeeta Claron Rosencrans, MD sp:slb D: 08/01/2011 19:42:26 ET T: 08/02/2011 08:59:33 ET JOB#: [id number]  cc: Darrick MeigsSangeeta Margert Edsall, MD, <Dictator> Karie Schwalbeichard I. Letvak, MD Darrick MeigsSANGEETA Lamontae Ricardo MD ELECTRONICALLY SIGNED 08/02/2011 12:45

## 2014-08-19 NOTE — Consult Note (Signed)
PATIENT NAME:  Belinda Mcgee, Belinda Mcgee MR#:  [id number] DATE OF BIRTH:  1961-01-07  DATE OF CONSULTATION:  08/03/2011  REFERRING PHYSICIAN:  Katharina Caper, MD  CONSULTING PHYSICIAN:  Rosalyn Gess. Meliton Samad, MD  REASON FOR CONSULTATION: Hand abscess and gram-negative rod sepsis.   HISTORY OF PRESENT ILLNESS: The patient is a 54 year old white female with a past history significant for alcohol abuse who was admitted on 04/06 after being found face down on the floor by her son. She cannot provide any history but the history and physical indicates that she may have been on the ground for approximately three days. She was admitted to the hospital and given aggressive hydration. She had scans of the head which were negative and plain films of both knees, the right ankle, and the left wrist, all of which showed no evidence for fracture. The left wrist and hand were significantly swollen and painful and an MRI was obtained which showed evidence for multiloculated abscesses. She was taken to the OR on April 7th with I and D of the abscesses and carpal tunnel release of the left wrist. The left wrist joint was also noted to be septic. The surgical cultures are currently pending. Blood cultures from admission are growing a gram-negative rod. The patient was found to have a white count of 30.4 on admission. She was started on vancomycin, Zosyn, and metronidazole. A urinalysis was fairly unremarkable, although a urine culture has grown greater than 100,000 CFUs per mL of Escherichia coli. She is unable to provide any significant history due to confusion.    ALLERGIES: None.   PAST MEDICAL HISTORY:  1. Alcohol abuse. She apparently drinks regularly.  2. Hypertension.  3. Hypercholesterolemia.  4. Status post back surgery.   SOCIAL HISTORY: The patient lives with her son. Per the social history on the history and physical, she does not smoke, drink, or have a history of injecting drug use, however, it appears she does drink  heavily. Further history is not possible.   FAMILY HISTORY: Positive for hypertension and hypercholesterolemia.   REVIEW OF SYSTEMS: Unable to obtain from the patient due to her confusion.   PHYSICAL EXAMINATION:   VITAL SIGNS: T-max of 100.0, T-current of 98.6, pulse 108, blood pressure 98/51, sats 94% on room air. Her blood pressure has been low to normal. She is not currently requiring pressors to maintain her blood pressure.   GENERAL: 54 year old thin white female markedly confused and lethargic.   HEENT: Normocephalic. There is some ecchymoses consistent with some possible trauma. Pupils are equal and reactive to light. Extraocular motion grossly intact. Sclerae, conjunctivae, and lids are without evidence for emboli or petechiae. Oropharynx there is evidence for blood in the mouth. There is no obvious sign of bleeding. This appears to be old blood. Mucous membranes were dry. Gums are in fair condition.   NECK: Supple. Full range of motion. Midline trachea. No lymphadenopathy. No thyromegaly.   CHEST: Clear to auscultation bilaterally with good air movement. No focal consolidation.   CARDIAC: Regular rate and rhythm without murmur, rub, or gallop.   ABDOMEN: Soft, nontender, nondistended. No hepatosplenomegaly. No hernias noted.   EXTREMITIES: Her left arm was wrapped in bandages. There are drains in place. The surgical wounds were not directly visualized. The right hand was atraumatic. There were black eschars and ecchymoses over both knees and the right foot had a bulla on the dorsum with some evidence for contusions present as well. She had pain with movement of the right foot.  The left arm was not moved due to the recent surgery.   SKIN: She had ecchymoses and ulcerations with eschar as described above. The left hand was wrapped in bandages and not directly observed. There were no stigmata of endocarditis, specifically no Janeway lesions nor Osler nodes.   NEUROLOGIC: The patient  was markedly confused. She was alert and oriented x0, although at other times she was able to answer questions such as who lived with her. For the most part she was unable to provide any significant history. She would follow commands with repetition such as opening her mouth but could not follow more complex commands.   PSYCHIATRIC: Difficult to assess due to her current clinical situation.   LABORATORY DATA: BUN 95, creatinine 1.45, bicarbonate 22, anion gap 14, AST 108, ALT 29, alkaline phosphatase 124, total bilirubin 0.9, white count 31.6, hemoglobin 8.3, platelet count 107, ANC 30.8. White count on admission was 30.4 and decreased at 26.7 yesterday. A urinalysis from admission had negative nitrites, trace leukocyte esterase, 1 red cell and 6 white cells per high-powered field. Urine culture is growing greater than 100,000 CFU/mL of Escherichia coli. Blood cultures are growing gram-negative rod in both anaerobic bottles. Wound cultures are currently pending.   A CT scan of the head without contrast showed no acute intracranial process. A CT scan of the cervical spine showed no fracture. A CT of the chest, abdomen, and pelvis showed mildly displaced left anterior sixth rib fracture and minimally displaced sternal fracture. There was soft tissue thickening of the right obturators internus musculature in the proximal left adductor musculature that was nonspecific but thought to be related to muscle injury hematoma. There were indeterminant pulmonary nodules, the largest of which was 6 mm in the left upper lobe. Right ankle x-ray showed no evidence for fracture. Left hand x-ray showed diffuse soft tissue swelling but no fracture. Left forearm x-ray showed a small crescentic displaced fracture fragment to the radial head possible. Chest x-ray showed no acute cardiopulmonary disease. Right knee x-ray showed no acute fracture. A pelvic x-ray showed no acute fracture. Left knee x-ray showed no acute fracture. MRI  of the left forearm showed focal heterogeneous intermediate T2 signal within the muscles of the deep distal forearm raising the possibility of abscess. There was signal present in multiple extensor tendon sheaths as well as the flexor tendon sheaths. There was diffuse soft tissue thickening signal in the subcutaneous fat along the dorsum of the distal forearm and wrist extending into the forearm. There was some mild fluid signal in the deep fascia of the forearm. An MRI of the hand without contrast showed extensive abnormality of the hand concerning for abscesses in the interosseous muscles of the hand and tenosynovitis extending into the tendon sheaths. Septic arthritis was also thought to be possibly present.   IMPRESSION: This is a 54 year old white female with a history of alcohol abuse admitted with evidence for trauma and left hand abscesses with gram-negative rod sepsis.   RECOMMENDATIONS:  1. Her hand is the likely source of her bacteremia. Cultures of the hand are currently pending. The initial Gram stain of the blood was read as gram-positive rod but has been corrected to gram-negative rod. She is unable to provide any history regarding her trauma but there is evidence that she fell forward with contusions, abrasions to her knees and feet as well as rib and sternal fractures.  2. She has undergone I and D of her hand and may need further debridement in  the future.  3. Will continue Zosyn for broad gram-negative coverage pending the results of her cultures.  4. Will stop the vancomycin and metronidazole.  5. She is not needing pressors to maintain her blood pressure. Would continue IV fluids.  6. Her urinalysis is not very impressive. The Escherichia coli in the urine is pansensitive and will be susceptible to the current antibiotic regimen.  7. Would continue CIWA protocol for her alcohol abuse.   This is a moderately complex Infectious Disease case. Thank you very much for involving me in Ms.  Hy's care.  ____________________________ Rosalyn GessMichael E. Katera Rybka, MD meb:drc D: 08/03/2011 15:02:32 ET T: 08/03/2011 17:08:38 ET JOB#: [id number]  cc: Rosalyn GessMichael E. Rishab Stoudt, MD, <Dictator> Berenise Hunton E Yanis Juma MD ELECTRONICALLY SIGNED 08/04/2011 14:44

## 2014-08-19 NOTE — Consult Note (Signed)
Impression: 54 yo WF w/ h/o EtOH abuse admitted with evidence for trauma and left hand abscesses with GNR sepsis.  Her hand is the likely source for her bacteremia.  The cultures of the hand are currently pending.  The initial gram stain was read as GPR, but has been corrected to GNR.  She is unable to provide any history regarding her trauma, but there is evidence that she fell forward with contusions and abrasions to her knees and feet as well as rib and sternal fractures.   She has undergone I&D of her hand.  She may need further debridement.  Will continue zosyn for broad GNR coverage pending the results of her cultures.   Will stop the vanco and metronidazole.  She is not longer needing pressors to maintain her BP. Continue IVF. Her u/a is not very impressive.  The E. coli in the urine is pansensitive and would be susceptible to the current antibiotic regimen. Continue CIWA protocol.     Electronic Signatures: Montana Bryngelson, Rosalyn GessMichael E (MD) (Signed on 08-Apr-13 15:01)  Authored   Last Updated: 08-Apr-13 15:02 by Brittanni Cariker, Rosalyn GessMichael E (MD)

## 2014-08-19 NOTE — Op Note (Signed)
PATIENT NAME:  Belinda Mcgee, Belinda Mcgee MR#:  [id number] DATE OF BIRTH:  [birth year]  DATE OF PROCEDURE:  08/02/2011  PREOPERATIVE DIAGNOSIS:  Multiple abscesses left forearm and hand.  POSTOPERATIVE DIAGNOSIS:  Multiple abscesses left forearm and hand.  PROCEDURE:  1. Irrigation and debridement left forearm and hand.  2. Open carpal tunnel release left wrist.  3. Irrigation and debridement with drainage of septic left wrist.  4. Application of negative pressure wound VAC therapy to the left forearm and hand.  SURGEON:  Dr. Hope Budds   ANESTHESIA: General anesthesia with LMA.   ESTIMATED BLOOD LOSS: 250 mL.   COMPLICATIONS: None.   SPECIMENS: Multiple cultures and one specimen sent to pathology.   DRAINS: VAC dressing as listed above.  INDICATIONS FOR THE PROCEDURE:  The patient is a 54 year old female with a complicated and unclear story of how she arrived to the situation. For complete details, please see the consult note dictated earlier. In brief, she presented to the Emergency Department with an acutely swollen and blistered left hand and forearm. She could not inform us of when this process started or what may have started it. To the best of her ability she described it as being there for at least three days. The patient was admitted to the general medicine service and started on IV antibiotics with recommendations to pursue MRI scan as soon as possible. Once the MRI had been obtained, the patient was scheduled for surgery for urgent irrigation and debridement of the multifocal loculated abscesses throughout her forearm and hand. The patient and her son expressed understanding of the severity of her situation and of the risks of her surgery that involved but were not limited to the risk of bleeding, continued infection, damage to nerves, vessels, and other structures, the need for other procedures and possible need for further debridement and/or progressing levels of amputation in the worst case  scenario. Informed consent was obtained prior to the procedure.   DESCRIPTION OF PROCEDURE IN DETAIL:  The patient was brought to the operating room and placed supine on the OR table. After general anesthesia was induced without complication the left upper extremity was prepped and draped in normal sterile fashion. Debridement  began with incising over the dorsum of the hand. With each incision a new pocket of pus was encountered and debrided. Each interosseal compartment was debrided individually to allow for a complete decompression of the deep spaces of the hand. Two more proximal incisions over the midline of the dorsum of the forearm were created and also released a significant amount of purulent material. One small vein was encountered over the dorsum of the forearm. This was ligated and removed. The extensor retinaculum was divided and each of the extensor compartments was released individually.  These did express some degree of phlegmon-type material.   Attention was then directed to the volar aspect of the arm. The thenar space was entered through a palmar incision. This was decompressed and did not yield a significant amount of purulent material. A curvilinear incision was made over the wrist crease to avoid crossing the wrist crease at 90 degree angles and a volar approach to the forearm was made through the tendon sheath for the FCR. The median nerve was identified and retracted. The carpal tunnel was identified at both its proximal and distal borders. The roof of the carpal tunnel was then divided carefully with attention to protect the neurovascular structures in the area. There was a significant amount of purulent material throughout  the deep spaces of the hand, the carpal tunnel, and the deep space of the forearm.  Once all these areas had been opened and decompressed, a total of 6 liters of normal saline impregnated with GU solution was then used to irrigate all the wounds created earlier.  Hemostasis was achieved with the tourniquet down and the VAC dressing was applied to an incisional VAC portion of the most proximal dorsal incision bridged to an open portion of the mid dorsal incision, bridged to severely denuded skin area on the dorsum of the hand, a partially closed ulnar-sided dorsal incision, and an open radial dorsal incision. This was then [facility] as a single volar incision. Prior to application of the volar VAC sponge the skin over the carpal tunnel was reapproximated to avoid any direct VAC contact with the median nerve. Adaptic was placed over the denuded skin and any closed skin areas prior to VAC sponge placement.  The patient was then wrapped in a bulky dressing [facility]. She did have some labile blood pressure during the case, which required the intermittent use of pressor medications. For this the medicine team has been notified that she would better be served in an Intensive Care bed. They will make further arrangements for this and follow her in that setting.  Disposition from the orthopedics prospective will be continued observation and plan for IV antibiotics. We will follow cultures and pathology report to tune in her antibiotics as needed. It is recommended that she get an infectious disease consult for further ongoing management. She should keep her arm elevated with the VAC dressing in place at all times.  She will need to return the operating room for further irrigation and debridement and VAC change versus closure of her incisions in the coming week.   Dr. Ernest PineHooten will assume care for this patient starting Monday morning.  At the end of the case all sponge counts and instrument counts were correct.     ____________________________ Collier Bullockharles V. Gabrille Kilbride, MD cvs:bjt D: 08/02/2011 22:23:48 ET T: 08/03/2011 11:05:51 ET JOB#: [id number]  cc: Collier Bullockharles V. Maripat Borba, MD, <Dictator> Otilio SaberHARLES V Camron Essman MD ELECTRONICALLY SIGNED 08/06/2011 21:27

## 2015-09-29 ENCOUNTER — Encounter: Payer: Self-pay | Admitting: Emergency Medicine

## 2015-09-29 ENCOUNTER — Emergency Department: Payer: Medicare Other

## 2015-09-29 ENCOUNTER — Emergency Department
Admission: EM | Admit: 2015-09-29 | Discharge: 2015-09-29 | Disposition: A | Discharge status: Home / Self Care | Payer: Medicare Other | Attending: Emergency Medicine | Admitting: Emergency Medicine

## 2015-09-29 DIAGNOSIS — R1010 Upper abdominal pain, unspecified: Secondary | ICD-10-CM

## 2015-09-29 DIAGNOSIS — Z87891 Personal history of nicotine dependence: Secondary | ICD-10-CM | POA: Diagnosis not present

## 2015-09-29 DIAGNOSIS — R197 Diarrhea, unspecified: Secondary | ICD-10-CM | POA: Diagnosis not present

## 2015-09-29 DIAGNOSIS — I10 Essential (primary) hypertension: Secondary | ICD-10-CM | POA: Insufficient documentation

## 2015-09-29 DIAGNOSIS — E876 Hypokalemia: Secondary | ICD-10-CM | POA: Diagnosis not present

## 2015-09-29 DIAGNOSIS — F419 Anxiety disorder, unspecified: Secondary | ICD-10-CM | POA: Diagnosis not present

## 2015-09-29 DIAGNOSIS — R0602 Shortness of breath: Secondary | ICD-10-CM | POA: Insufficient documentation

## 2015-09-29 HISTORY — DX: Anxiety disorder, unspecified: F41.9

## 2015-09-29 HISTORY — DX: Essential (primary) hypertension: I10

## 2015-09-29 HISTORY — DX: Insomnia, unspecified: G47.00

## 2015-09-29 HISTORY — DX: Other injury of unspecified body region, initial encounter: T14.8XXA

## 2015-09-29 LAB — URINALYSIS COMPLETE WITH MICROSCOPIC (ARMC ONLY)
BILIRUBIN URINE: NEGATIVE
GLUCOSE, UA: NEGATIVE mg/dL
Ketones, ur: NEGATIVE mg/dL
NITRITE: NEGATIVE
Protein, ur: NEGATIVE mg/dL
SPECIFIC GRAVITY, URINE: 1.002 — AB (ref 1.005–1.030)
pH: 7 (ref 5.0–8.0)

## 2015-09-29 LAB — LIPASE, BLOOD: Lipase: 25 U/L (ref 11–51)

## 2015-09-29 LAB — COMPREHENSIVE METABOLIC PANEL
ALT: 23 U/L (ref 14–54)
ANION GAP: 13 (ref 5–15)
AST: 33 U/L (ref 15–41)
Albumin: 4.6 g/dL (ref 3.5–5.0)
Alkaline Phosphatase: 110 U/L (ref 38–126)
BUN: <5 mg/dL — ABNORMAL LOW (ref 6–20)
CHLORIDE: 92 mmol/L — AB (ref 101–111)
CO2: 26 mmol/L (ref 22–32)
Calcium: 9 mg/dL (ref 8.9–10.3)
Creatinine, Ser: 0.73 mg/dL (ref 0.44–1.00)
GFR calc non Af Amer: >60 mL/min (ref >60)
Glucose, Bld: 121 mg/dL — ABNORMAL HIGH (ref 65–99)
Potassium: 2.8 mmol/L — CL (ref 3.5–5.1)
SODIUM: 131 mmol/L — AB (ref 135–145)
Total Bilirubin: 1.3 mg/dL — ABNORMAL HIGH (ref 0.3–1.2)
Total Protein: 7.6 g/dL (ref 6.5–8.1)

## 2015-09-29 LAB — CBC
HCT: 42.4 % (ref 35.0–47.0)
HEMOGLOBIN: 14.6 g/dL (ref 12.0–16.0)
MCH: 31.4 pg (ref 26.0–34.0)
MCHC: 34.3 g/dL (ref 32.0–36.0)
MCV: 91.6 fL (ref 80.0–100.0)
Platelets: 214 10*3/uL (ref 150–440)
RBC: 4.63 MIL/uL (ref 3.80–5.20)
RDW: 12.4 % (ref 11.5–14.5)
WBC: 10.1 10*3/uL (ref 3.6–11.0)

## 2015-09-29 LAB — TROPONIN I: Troponin I: <0.03 ng/mL (ref <0.031)

## 2015-09-29 LAB — FIBRIN DERIVATIVES D-DIMER (ARMC ONLY): FIBRIN DERIVATIVES D-DIMER (ARMC): 284 (ref 0–499)

## 2015-09-29 MED ORDER — POTASSIUM CHLORIDE ER 10 MEQ PO TBCR: 40.0000 meq | EXTENDED_RELEASE_TABLET | Freq: Once | ORAL | Status: DC

## 2015-09-29 MED ORDER — POTASSIUM CHLORIDE CRYS ER 20 MEQ PO TBCR
40.0000 meq | EXTENDED_RELEASE_TABLET | Freq: Once | ORAL | Status: AC
  Administered 2015-09-29: 40 meq via ORAL
  Filled 2015-09-29: qty 2

## 2015-09-29 NOTE — ED Provider Notes (Signed)
[facility] Emergency Department Provider Note   ____________________________________________  Time seen: Approximately 815 AM  I have reviewed the triage vital signs and the nursing notes.   HISTORY  Chief Complaint Abdominal Pain   HPI Belinda EonCindy L Mcgee is a [age] y.o. female with a history of hypertension and anxiety who is presenting to the emergency department today with 1 week of upper abdominal cramping and diarrhea. She says that she has 1-3 episodes of diarrhea per day. No known sick contacts. Says that the pain is a 3 out of 10 intermittent. There is no radiation of the pain. She also describes shortness of breath but says this may be related to anxiety. She says that she has been more anxious over the past week since her diarrhea and abdominal cramping started. She denies any dysuria. Also with decreased appetite. No shortness of breath at this time. Denies any chest pain. Denies any blood in her stool. Says that she has not had any antibiotics for months.   Past Medical History  Diagnosis Date  . Hypertension   . Animal bite   . Anxiety   . Insomnia     There are no active problems to display for this patient.   Past Surgical History  Procedure Laterality Date  . Arm surgery      No current outpatient prescriptions on file.  Allergies Review of patient's allergies indicates no known allergies.  History reviewed. No pertinent family history.  Social History Social History  Substance Use Topics  . Smoking status: Former Games developermoker  . Smokeless tobacco: None  . Alcohol Use: Yes    Review of Systems Constitutional: No fever/chills Eyes: No visual changes. ENT: No sore throat. Cardiovascular: Denies chest pain. Respiratory: Denies shortness of breath. Gastrointestinal:   No nausea, no vomiting.    No constipation. Genitourinary: Negative for dysuria. Musculoskeletal: Negative for back pain. Skin: Negative for rash. Neurological:  Negative for headaches, focal weakness or numbness.  10-point ROS otherwise negative.  ____________________________________________   PHYSICAL EXAM:  VITAL SIGNS: ED Triage Vitals  Enc Vitals Group     BP 09/29/15 0732 144/91 mmHg     Pulse Rate 09/29/15 0732 110     Resp 09/29/15 0732 18     Temp 09/29/15 0732 97.9 F (36.6 C)     Temp Source 09/29/15 0732 Oral     SpO2 09/29/15 0732 98 %     Weight 09/29/15 0738 140 lb (63.504 kg)     Height 09/29/15 0738 5\' 6"  (1.676 m)     Head Cir --      Peak Flow --      Pain Score 09/29/15 0735 4     Pain Loc --      Pain Edu? --      Excl. in GC? --     Constitutional: Alert and oriented. Well appearing and in no acute distress. Eyes: Conjunctivae are normal. PERRL. EOMI. Head: Atraumatic. Nose: No congestion/rhinnorhea. Mouth/Throat: Mucous membranes are moist.   Neck: No stridor.   Cardiovascular: Normal rate, regular rhythm. Grossly normal heart sounds.   Respiratory: Normal respiratory effort.  No retractions. Lungs CTAB. Gastrointestinal: Soft and nontender. No distention. Musculoskeletal: No lower extremity tenderness nor edema.  No joint effusions. Neurologic:  Normal speech and language. No gross focal neurologic deficits are appreciated. No gait instability. Skin:  Skin is warm, dry and intact. No rash noted. Psychiatric: Mood and affect are normal. Speech and behavior are normal.  ____________________________________________  LABS (all labs ordered are listed, but only abnormal results are displayed)  Labs Reviewed  COMPREHENSIVE METABOLIC PANEL - Abnormal; Notable for the following:    Sodium 131 (*)    Potassium 2.8 (*)    Chloride 92 (*)    Glucose, Bld 121 (*)    BUN <5 (*)    Total Bilirubin 1.3 (*)    All other components within normal limits  URINALYSIS COMPLETEWITH MICROSCOPIC ([facility] ONLY) - Abnormal; Notable for the following:    Color, Urine YELLOW (*)    APPearance CLEAR (*)    Specific  Gravity, Urine 1.002 (*)    Hgb urine dipstick 1+ (*)    Leukocytes, UA 1+ (*)    Bacteria, UA RARE (*)    Squamous Epithelial / LPF 0-5 (*)    All other components within normal limits  LIPASE, BLOOD  CBC  TROPONIN I  FIBRIN DERIVATIVES D-DIMER ([facility] ONLY)   ____________________________________________  EKG ED ECG REPORT I, Arelia Longest, the attending physician, personally viewed and interpreted this ECG.   Date: 09/29/2015  EKG Time: 823  Rate: 82  Rhythm: normal sinus rhythm  Axis: Normal axis  Intervals:none  ST&T Change: No ST segment elevation or depression. No abnormal T-wave inversion.  ____________________________________________  RADIOLOGY   ____________________________________________   PROCEDURES   ____________________________________________   INITIAL IMPRESSION / ASSESSMENT AND PLAN / ED COURSE  Pertinent labs & imaging results that were available during my care of the patient were reviewed by me and considered in my medical decision making (see chart for details).  ----------------------------------------- 11:02 AM on 09/29/2015 -----------------------------------------  Patient resting comfortably. Very reassuring lab workup. Possible viral cause for her diarrhea. Also likely anxiety component. Will be discharged home. Found to have decreased potassium which she appears to have had in the past. We'll also discharge her with a prescription for 1 potassium. Possible UTI but patient denies any burning or frequency. We'll send for culture. Likely anxiety causing her shortness of breath. ____________________________________________   FINAL CLINICAL IMPRESSION(S) / ED DIAGNOSES  Upper abdominal pain. Diarrhea.    NEW MEDICATIONS STARTED DURING THIS VISIT:  New Prescriptions   No medications on file     Note:  This document was prepared using Dragon voice recognition software and may include unintentional dictation errors.    Myrna Blazer, MD 09/29/15 [phone]

## 2015-09-29 NOTE — ED Notes (Signed)
Potassium 2.8. Md notified.

## 2015-09-29 NOTE — ED Notes (Signed)
Pt states she has been having memory problems as well that have been going.  Pt does appear anxious in triage.

## 2015-09-29 NOTE — ED Notes (Signed)
Upper abdominal pain started 1-2 days ago. C/o diarrhea with several episodes per day for the past week.  Pt states she was on medication for a cat attack which she thinks is an antibiotic.  Pt states she also has anxiety as well which is causing her to be short of breath.

## 2015-09-29 NOTE — ED Notes (Signed)
Pt transported to xray 

## 2015-10-03 LAB — URINE CULTURE

## 2018-12-27 ENCOUNTER — Emergency Department (EMERGENCY_DEPARTMENT_HOSPITAL)
Admission: EM | Admit: 2018-12-27 | Discharge: 2018-12-28 | Disposition: A | Discharge status: Other Acute Inpatient Hospital | Payer: Medicare Other | Source: Home / Self Care | Attending: Emergency Medicine | Admitting: Emergency Medicine

## 2018-12-27 ENCOUNTER — Other Ambulatory Visit: Payer: Self-pay

## 2018-12-27 DIAGNOSIS — Z20828 Contact with and (suspected) exposure to other viral communicable diseases: Secondary | ICD-10-CM | POA: Insufficient documentation

## 2018-12-27 DIAGNOSIS — F23 Brief psychotic disorder: Secondary | ICD-10-CM

## 2018-12-27 DIAGNOSIS — F29 Unspecified psychosis not due to a substance or known physiological condition: Secondary | ICD-10-CM

## 2018-12-27 DIAGNOSIS — Z87891 Personal history of nicotine dependence: Secondary | ICD-10-CM | POA: Insufficient documentation

## 2018-12-27 DIAGNOSIS — I10 Essential (primary) hypertension: Secondary | ICD-10-CM | POA: Insufficient documentation

## 2018-12-27 DIAGNOSIS — F200 Paranoid schizophrenia: Secondary | ICD-10-CM | POA: Diagnosis not present

## 2018-12-27 DIAGNOSIS — R410 Disorientation, unspecified: Secondary | ICD-10-CM | POA: Diagnosis not present

## 2018-12-27 LAB — SALICYLATE LEVEL: Salicylate Lvl: <7 mg/dL (ref 2.8–30.0)

## 2018-12-27 LAB — CBC WITH DIFFERENTIAL/PLATELET
Abs Immature Granulocytes: 0.05 10*3/uL (ref 0.00–0.07)
Basophils Absolute: 0 10*3/uL (ref 0.0–0.1)
Basophils Relative: 0 %
Eosinophils Absolute: 0 10*3/uL (ref 0.0–0.5)
Eosinophils Relative: 0 %
HCT: 35.1 % — ABNORMAL LOW (ref 36.0–46.0)
Hemoglobin: 11.6 g/dL — ABNORMAL LOW (ref 12.0–15.0)
Immature Granulocytes: 0 %
Lymphocytes Relative: 12 %
Lymphs Abs: 1.4 10*3/uL (ref 0.7–4.0)
MCH: 28.9 pg (ref 26.0–34.0)
MCHC: 33 g/dL (ref 30.0–36.0)
MCV: 87.3 fL (ref 80.0–100.0)
Monocytes Absolute: 0.9 10*3/uL (ref 0.1–1.0)
Monocytes Relative: 8 %
Neutro Abs: 8.9 10*3/uL — ABNORMAL HIGH (ref 1.7–7.7)
Neutrophils Relative %: 80 %
Platelets: 325 10*3/uL (ref 150–400)
RBC: 4.02 MIL/uL (ref 3.87–5.11)
RDW: 13.5 % (ref 11.5–15.5)
WBC: 11.3 10*3/uL — ABNORMAL HIGH (ref 4.0–10.5)
nRBC: 0 % (ref 0.0–0.2)

## 2018-12-27 LAB — COMPREHENSIVE METABOLIC PANEL
ALT: 30 U/L (ref 0–44)
AST: 46 U/L — ABNORMAL HIGH (ref 15–41)
Albumin: 4 g/dL (ref 3.5–5.0)
Alkaline Phosphatase: 80 U/L (ref 38–126)
Anion gap: 15 (ref 5–15)
BUN: 23 mg/dL — ABNORMAL HIGH (ref 6–20)
CO2: 20 mmol/L — ABNORMAL LOW (ref 22–32)
Calcium: 8.8 mg/dL — ABNORMAL LOW (ref 8.9–10.3)
Chloride: 107 mmol/L (ref 98–111)
Creatinine, Ser: 1.04 mg/dL — ABNORMAL HIGH (ref 0.44–1.00)
GFR calc Af Amer: >60 mL/min (ref >60)
GFR calc non Af Amer: 59 mL/min — ABNORMAL LOW (ref >60)
Glucose, Bld: 112 mg/dL — ABNORMAL HIGH (ref 70–99)
Potassium: 2.9 mmol/L — ABNORMAL LOW (ref 3.5–5.1)
Sodium: 142 mmol/L (ref 135–145)
Total Bilirubin: 1 mg/dL (ref 0.3–1.2)
Total Protein: 6.9 g/dL (ref 6.5–8.1)

## 2018-12-27 LAB — LIPASE, BLOOD: Lipase: 26 U/L (ref 11–51)

## 2018-12-27 LAB — ETHANOL: Alcohol, Ethyl (B): <10 mg/dL (ref <10)

## 2018-12-27 LAB — ACETAMINOPHEN LEVEL: Acetaminophen (Tylenol), Serum: <10 ug/mL — ABNORMAL LOW (ref 10–30)

## 2018-12-27 LAB — SARS CORONAVIRUS 2 BY RT PCR (HOSPITAL ORDER, PERFORMED IN ~~LOC~~ HOSPITAL LAB): SARS Coronavirus 2: NEGATIVE

## 2018-12-27 MED ORDER — HALOPERIDOL LACTATE 5 MG/ML IJ SOLN
INTRAMUSCULAR | Status: AC
  Administered 2018-12-27: 5 mg via INTRAMUSCULAR
  Filled 2018-12-27: qty 1

## 2018-12-27 MED ORDER — DIPHENHYDRAMINE HCL 50 MG/ML IJ SOLN
25.0000 mg | Freq: Once | INTRAMUSCULAR | Status: AC
  Administered 2018-12-27: 20:00:00 25 mg via INTRAMUSCULAR

## 2018-12-27 MED ORDER — HALOPERIDOL LACTATE 5 MG/ML IJ SOLN
5.0000 mg | Freq: Once | INTRAMUSCULAR | Status: AC
  Administered 2018-12-27: 20:00:00 5 mg via INTRAMUSCULAR

## 2018-12-27 MED ORDER — LORAZEPAM 2 MG/ML IJ SOLN
INTRAMUSCULAR | Status: AC
  Administered 2018-12-27: 2 mg via INTRAMUSCULAR
  Filled 2018-12-27: qty 1

## 2018-12-27 MED ORDER — LORAZEPAM 2 MG/ML IJ SOLN
2.0000 mg | Freq: Once | INTRAMUSCULAR | Status: AC
  Administered 2018-12-27: 20:00:00 2 mg via INTRAMUSCULAR

## 2018-12-27 MED ORDER — DIPHENHYDRAMINE HCL 50 MG/ML IJ SOLN
INTRAMUSCULAR | Status: AC
  Administered 2018-12-27: 25 mg via INTRAMUSCULAR
  Filled 2018-12-27: qty 1

## 2018-12-27 NOTE — ED Notes (Signed)
O2 removed by this Probation officer. O2 currently 93% Will continue to monitor.
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
## 2018-12-27 NOTE — ED Notes (Signed)
Patient currently resting with eyes closed in bed. Respirations un labored.

## 2018-12-27 NOTE — ED Notes (Signed)
Patient laying in bed with eyes closed. Respirations even and unlabored.
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
## 2018-12-27 NOTE — ED Notes (Signed)
Patient weaned to 1 liter O2. Patient O2 96%.

## 2018-12-27 NOTE — ED Notes (Signed)
Patient placed on 2 liter O2 due to O2 being 89-90%. EDP stafford made aware.

## 2018-12-27 NOTE — ED Provider Notes (Addendum)
[facility] Emergency Department Provider Note  ____________________________________________  Time seen: Approximately 8:22 PM  I have reviewed the triage vital signs and the nursing notes.   HISTORY  Chief Complaint Altered Mental Status    Level 5 Caveat: Portions of the History and Physical including HPI and review of systems are unable to be completely obtained due to patient being a poor historian   HPI Belinda Mcgee is a 58 y.o. female with a history of hypertension and anxiety who is brought to the ED under involuntary commitment initiated by her son  due to apparent hallucinations and delusions and being dangerous to herself or others in the son's estimation.  No history of psychosis in electronic medical record.  In the treatment room the patient is cooperative with changing her close but subsequently is behaving bizarrely, not talking or answering questions, attempting to dance around the room to distract staff and attempt to flee.  She makes impulsive movements including attempting to hit staff.    Past Medical History:  Diagnosis Date  . Animal bite   . Anxiety   . Hypertension   . Insomnia      There are no active problems to display for this patient.    Past Surgical History:  Procedure Laterality Date  . arm surgery       Prior to Admission medications   Medication Sig Start Date End Date Taking? Authorizing Provider  potassium chloride (K-DUR) 10 MEQ tablet Take 4 tablets (40 mEq total) by mouth once. 09/29/15   Schaevitz, Myra Rude, MD     Allergies Patient has no known allergies.   History reviewed. No pertinent family history.  Social History Social History   Tobacco Use  . Smoking status: Former Games developer  . Smokeless tobacco: Never Used  Substance Use Topics  . Alcohol use: Yes  . Drug use: Not on file    Review of Systems Level 5 Caveat: Portions of the History and Physical including HPI and review of  systems are unable to be completely obtained due to patient being a poor historian   Constitutional:   No known fever.  ENT:   No rhinorrhea. Cardiovascular:   No chest pain or syncope. Respiratory:   No dyspnea or cough. Gastrointestinal:   Negative for abdominal pain, vomiting and diarrhea.  Musculoskeletal:   Negative for focal pain or swelling ____________________________________________   PHYSICAL EXAM:  VITAL SIGNS: ED Triage Vitals  Enc Vitals Group     BP --      Pulse --      Resp --      Temp --      Temp src --      SpO2 --      Weight 12/27/18 2012 175 lb (79.4 kg)     Height 12/27/18 2012 5\' 8"  (1.727 m)     Head Circumference --      Peak Flow --      Pain Score 12/27/18 2011 0     Pain Loc --      Pain Edu? --      Excl. in GC? --     Vital signs reviewed, nursing assessments reviewed.   Constitutional:   Awake and alert.  Not oriented.  Nontoxic appearance Eyes:   Conjunctivae are normal. EOMI. PERRL. ENT      Head:   Normocephalic and atraumatic.      Nose:   No congestion/rhinnorhea.       Mouth/Throat:  MMM, no pharyngeal erythema. No peritonsillar mass.       Neck:   No meningismus. Full ROM. Hematological/Lymphatic/Immunilogical:   No cervical lymphadenopathy. Cardiovascular:   RRR, rate about 80. Symmetric bilateral radial and DP pulses.  No murmurs. Cap refill less than 2 seconds. Respiratory:   Normal respiratory effort without tachypnea/retractions. Breath sounds are clear and equal bilaterally. No wheezes/rales/rhonchi. Gastrointestinal:   Soft and nontender. Non distended. There is no CVA tenderness.  No rebound, rigidity, or guarding.  Musculoskeletal:   Normal range of motion in all extremities. No joint effusions.  No lower extremity tenderness.  No edema. Neurologic:   Not interacting verbally Motor grossly intact.  Steady gait, normal balance and coordination Skin:    Skin is warm, dry and intact. No rash noted.  No petechiae,  purpura, or bullae.  ____________________________________________    LABS (pertinent positives/negatives) (all labs ordered are listed, but only abnormal results are displayed) Labs Reviewed - No data to display ____________________________________________   EKG    ____________________________________________    RADIOLOGY  No results found.  ____________________________________________   PROCEDURES .Critical Care Performed by: Sharman CheekStafford, Janeka Libman, MD Authorized by: Sharman CheekStafford, Cortina Vultaggio, MD   Critical care provider statement:    Critical care time (minutes):  35   Critical care time was exclusive of:  Separately billable procedures and treating other patients   Critical care was necessary to treat or prevent imminent or life-threatening deterioration of the following conditions:  CNS failure or compromise   Critical care was time spent personally by me on the following activities:  Development of treatment plan with patient or surrogate, discussions with consultants, evaluation of patient's response to treatment, examination of patient, obtaining history from patient or surrogate, ordering and performing treatments and interventions, ordering and review of laboratory studies, ordering and review of radiographic studies, pulse oximetry, re-evaluation of patient's condition and review of old charts    ____________________________________________    CLINICAL IMPRESSION / ASSESSMENT AND PLAN / ED COURSE  Pertinent labs & imaging results that were available during my care of the patient were reviewed by me and considered in my medical decision making (see chart for details).   Belinda Mcgee was evaluated in Emergency Department on 12/27/2018 for the symptoms described in the history of present illness. She was evaluated in the context of the global COVID-19 pandemic, which necessitated consideration that the patient might be at risk for infection with the SARS-CoV-2 virus that  causes COVID-19. Institutional protocols and algorithms that pertain to the evaluation of patients at risk for COVID-19 are in a state of rapid change based on information released by regulatory bodies including the CDC and federal and state organizations. These policies and algorithms were followed during the patient's care in the ED.   Patient presents with bizarre behavior, psychotic episode versus intoxication.  Required Benadryl 50 mg IM, Haldol 5 mg IM, Ativan 2 mg IM to calm the patient to maintain a safe environment for herself and others.  Continue IVC, consult psychiatry.  No evidence of any acute medical issue at present time.  Clinical Course as of Dec 27 2050  Tue Dec 27, 2018  2052 Patient sleeping comfortably, not in distress.   [PS]    Clinical Course User Index [PS] Sharman CheekStafford, Yvanna Vidas, MD    ----------------------------------------- 8:53 PM on 12/27/2018 -----------------------------------------  Patient requiring 2 L nasal cannula due to hypoxia to 89% while sleeping.   ____________________________________________   FINAL CLINICAL IMPRESSION(S) / ED DIAGNOSES  Final diagnoses:  Acute psychosis [facility])     ED Discharge Orders    None      Portions of this note were generated with dragon dictation software. Dictation errors may occur despite best attempts at proofreading.   Carrie Mew, MD 12/27/18 2025    Carrie Mew, MD 12/27/18 2053

## 2018-12-28 ENCOUNTER — Inpatient Hospital Stay
Admission: AD | Admit: 2018-12-28 | Discharge: 2019-01-02 | DRG: 948 | Disposition: A | Discharge status: Home / Self Care | Payer: Medicare Other | Source: Intra-hospital | Attending: Psychiatry | Admitting: Psychiatry

## 2018-12-28 ENCOUNTER — Other Ambulatory Visit: Payer: Self-pay

## 2018-12-28 ENCOUNTER — Encounter: Payer: Self-pay | Admitting: Emergency Medicine

## 2018-12-28 DIAGNOSIS — I10 Essential (primary) hypertension: Secondary | ICD-10-CM | POA: Diagnosis present

## 2018-12-28 DIAGNOSIS — Z87891 Personal history of nicotine dependence: Secondary | ICD-10-CM | POA: Diagnosis not present

## 2018-12-28 DIAGNOSIS — F10239 Alcohol dependence with withdrawal, unspecified: Secondary | ICD-10-CM | POA: Diagnosis present

## 2018-12-28 DIAGNOSIS — F29 Unspecified psychosis not due to a substance or known physiological condition: Secondary | ICD-10-CM | POA: Insufficient documentation

## 2018-12-28 DIAGNOSIS — F200 Paranoid schizophrenia: Secondary | ICD-10-CM | POA: Diagnosis not present

## 2018-12-28 DIAGNOSIS — Z20828 Contact with and (suspected) exposure to other viral communicable diseases: Secondary | ICD-10-CM | POA: Diagnosis present

## 2018-12-28 DIAGNOSIS — G894 Chronic pain syndrome: Secondary | ICD-10-CM | POA: Diagnosis present

## 2018-12-28 DIAGNOSIS — R410 Disorientation, unspecified: Principal | ICD-10-CM | POA: Diagnosis present

## 2018-12-28 LAB — BASIC METABOLIC PANEL
Anion gap: 13 (ref 5–15)
BUN: 16 mg/dL (ref 6–20)
CO2: 23 mmol/L (ref 22–32)
Calcium: 8.9 mg/dL (ref 8.9–10.3)
Chloride: 105 mmol/L (ref 98–111)
Creatinine, Ser: 0.69 mg/dL (ref 0.44–1.00)
GFR calc Af Amer: >60 mL/min (ref >60)
GFR calc non Af Amer: >60 mL/min (ref >60)
Glucose, Bld: 105 mg/dL — ABNORMAL HIGH (ref 70–99)
Potassium: 3.3 mmol/L — ABNORMAL LOW (ref 3.5–5.1)
Sodium: 141 mmol/L (ref 135–145)

## 2018-12-28 MED ORDER — THIAMINE HCL 100 MG/ML IJ SOLN
100.0000 mg | Freq: Once | INTRAMUSCULAR | Status: AC
  Administered 2018-12-28: 100 mg via INTRAMUSCULAR
  Filled 2018-12-28: qty 2

## 2018-12-28 MED ORDER — LORAZEPAM 2 MG/ML IJ SOLN: 2.0000 mg | Freq: Four times a day (QID) | INTRAMUSCULAR | Status: DC | PRN

## 2018-12-28 MED ORDER — MAGNESIUM HYDROXIDE 400 MG/5ML PO SUSP: 30.0000 mL | Freq: Every day | ORAL | Status: DC | PRN

## 2018-12-28 MED ORDER — POTASSIUM CHLORIDE CRYS ER 20 MEQ PO TBCR
40.0000 meq | EXTENDED_RELEASE_TABLET | Freq: Once | ORAL | Status: AC
  Administered 2018-12-28: 40 meq via ORAL
  Filled 2018-12-28: qty 2

## 2018-12-28 MED ORDER — VITAMIN B-12 1000 MCG PO TABS
1000.0000 ug | ORAL_TABLET | Freq: Every day | ORAL | Status: DC
  Administered 2018-12-28 – 2019-01-02 (×6): 1000 ug via ORAL
  Filled 2018-12-28 (×6): qty 1

## 2018-12-28 MED ORDER — TRAZODONE HCL 50 MG PO TABS
50.0000 mg | ORAL_TABLET | Freq: Every evening | ORAL | Status: DC | PRN
  Administered 2018-12-30 – 2019-01-02 (×2): 50 mg via ORAL
  Filled 2018-12-28 (×2): qty 1

## 2018-12-28 MED ORDER — MAGNESIUM OXIDE 400 (241.3 MG) MG PO TABS
800.0000 mg | ORAL_TABLET | Freq: Once | ORAL | Status: AC
  Administered 2018-12-28: 16:00:00 800 mg via ORAL
  Filled 2018-12-28 (×2): qty 2

## 2018-12-28 MED ORDER — VITAMIN B-1 100 MG PO TABS
250.0000 mg | ORAL_TABLET | Freq: Every day | ORAL | Status: DC
  Administered 2018-12-29 – 2019-01-02 (×5): 250 mg via ORAL
  Filled 2018-12-28 (×5): qty 3

## 2018-12-28 MED ORDER — LORAZEPAM 2 MG PO TABS
2.0000 mg | ORAL_TABLET | Freq: Four times a day (QID) | ORAL | Status: DC | PRN
  Administered 2018-12-28 – 2019-01-02 (×3): 2 mg via ORAL
  Filled 2018-12-28 (×3): qty 1

## 2018-12-28 MED ORDER — ALUM & MAG HYDROXIDE-SIMETH 200-200-20 MG/5ML PO SUSP: 30.0000 mL | ORAL | Status: DC | PRN

## 2018-12-28 MED ORDER — ACETAMINOPHEN 325 MG PO TABS
650.0000 mg | ORAL_TABLET | Freq: Four times a day (QID) | ORAL | Status: DC | PRN
  Administered 2019-01-02: 650 mg via ORAL
  Filled 2018-12-28: qty 2

## 2018-12-28 NOTE — Plan of Care (Signed)
  Problem: Education: Goal: Knowledge of [facility] Education information/materials will improve Outcome:  Writer instructed  patient  on [place] , unable  at this time to fully understand

## 2018-12-28 NOTE — BH Assessment (Signed)
Assessment Note  Belinda Mcgee is an 58 y.o. female presented to [facility] ED via law enforcement in handcuffs and under involuntary commitment status (IVC). Per the ED triage nursing note, the patient was initially cooperative. Once she was changed into the behavioral health scrubs, she began acting psychotic, hitting nursing staff, [employer] securities, and [employer] staff.  Due to the patient's psychotic behaviors and aggression, she was given medications to assist with her major aggressive behaviors towards all staff; therefore, pt was unable to be assessed by TTS staff. Assessment information was gathered from chart review and pt's son.   Diagnosis: Psychosis  Past Medical History:  Past Medical History:  Diagnosis Date  . Animal bite   . Anxiety   . Hypertension   . Insomnia     Past Surgical History:  Procedure Laterality Date  . arm surgery      Family History: History reviewed. No pertinent family history.  Social History:  reports that she has quit smoking. She has never used smokeless tobacco. She reports current alcohol use. No history on file for drug.  Additional Social History:  Alcohol / Drug Use Pain Medications: See MAR Prescriptions: See MAR Over the Counter: See MAR History of alcohol / drug use?: (UTA)  CIWA: CIWA-Ar BP: 118/64 Pulse Rate: 93 COWS:    Allergies: No Known Allergies  Home Medications: (Not in a hospital admission)   OB/GYN Status:  No LMP recorded. Patient is postmenopausal.  General Assessment Data Location of Assessment[facility] ED TTS Assessment: In system Is this a Tele or Face-to-Face Assessment?: Face-to-Face Is this an Initial Assessment or a Re-assessment for this encounter?: Initial Assessment Patient Accompanied by:: N/A Language Other than English: No Living Arrangements: Other (Comment)(Private Residence) What gender do you identify as?: Female Marital status: Single Maiden name: Belinda BellowsUKN Pregnancy Status: No Living Arrangements:  Children(Pt lives with her son) Can pt return to current living arrangement?: Yes Admission Status: Involuntary Petitioner: ED Attending Is patient capable of signing voluntary admission?: No Referral Source: Self/Family/Friend [facility]  Medical Screening Exam [facility] Walk-in ONLY) Medical Exam completed: Yes  Crisis Care Plan Living Arrangements: Children(Pt lives with her son) Legal Guardian: Other:(Self) Name of Psychiatrist: UKN Name of Therapist: UKN  Education Status Is patient currently in school?: No Is the patient employed, unemployed or receiving disability?Marland Kitchen: Belinda Mcgee(UKN -UTA)  Risk to self with the past 6 months Suicidal Ideation: No Has patient been a risk to self within the past 6 months prior to admission? : No Suicidal Intent: No Has patient had any suicidal intent within the past 6 months prior to admission? : No Is patient at risk for suicide?: No Suicidal Plan?: No Has patient had any suicidal plan within the past 6 months prior to admission? : No Access to Means: No What has been your use of drugs/alcohol within the last 12 months?: UKN - UTA Previous Attempts/Gestures: No How many times?: 0 Other Self Harm Risks: None Reported Triggers for Past Attempts: Unknown Intentional Self Injurious Behavior: None Family Suicide History: Unknown Recent stressful life event(s): (UKN) Persecutory voices/beliefs?: No Depression: No Depression Symptoms: Belinda Mcgee(UKN - UTA) Substance abuse history and/or treatment for substance abuse?: No Suicide prevention information given to non-admitted patients: Not applicable  Risk to Others within the past 6 months Homicidal Ideation: No Does patient have any lifetime risk of violence toward others beyond the six months prior to admission? : No Thoughts of Harm to Others: No Current Homicidal Intent: No Current Homicidal Plan: No  Access to Homicidal Means: No Identified Victim: None History of harm to others?: No Assessment of  Violence: On admission Violent Behavior Description: Fighting ED staff and security staff Does patient have access to weapons?: No Criminal Charges Pending?: No Does patient have a court date: No Is patient on probation?: No  Psychosis Hallucinations: (UTA) Delusions: (UTA)  Mental Status Report Appearance/Hygiene: In scrubs Eye Contact: Unable to Assess Motor Activity: Unable to assess Speech: Unable to assess Level of Consciousness: Unable to assess Mood: Other (Comment)(UTA) Affect: Unable to Assess Anxiety Level: Severe Thought Processes: Unable to Assess Judgement: Unable to Assess Orientation: Unable to assess Obsessive Compulsive Thoughts/Behaviors: Unable to Assess  Cognitive Functioning Concentration: Unable to Assess Memory: Unable to Assess Is patient IDD: No Insight: Unable to Assess Impulse Control: Unable to Assess Sleep: Unable to Assess Vegetative Symptoms: Unable to Assess  ADLScreening [facility] Assessment Services) Patient's cognitive ability adequate to safely complete daily activities?: Yes Patient able to express need for assistance with ADLs?: Yes Independently performs ADLs?: Yes (appropriate for developmental age)  Prior Inpatient Therapy Prior Inpatient Therapy: Yes Prior Therapy Dates: [facility] - Information gathered from son Prior Therapy Facilty/Provider(s): Herbalist - Information gathered from son Reason for Treatment: UKN  Prior Outpatient Therapy Prior Outpatient Therapy: [facility])  ADL Screening (condition at time of admission) Patient's cognitive ability adequate to safely complete daily activities?: Yes Patient able to express need for assistance with ADLs?: Yes Independently performs ADLs?: Yes (appropriate for developmental age)       Abuse/Neglect Assessment (Assessment to be complete while patient is alone) Abuse/Neglect Assessment Can Be Completed: Yes Physical Abuse: Denies Verbal Abuse: Denies Sexual Abuse: Denies Exploitation of  patient/patient's resources: Denies Self-Neglect: Denies Values / Beliefs Cultural Requests During Hospitalization: None Spiritual Requests During Hospitalization: None Consults Spiritual Care Consult Needed: No Social Work Consult Needed: No Regulatory affairs officer (For Healthcare) Does Patient Have a Medical Advance Directive?: Unable to assess, patient is non-responsive or altered mental status       Child/Adolescent Assessment Running Away Risk: (Patient is an adult)  Disposition:  Disposition Initial Assessment Completed for this Encounter: Yes Disposition of Patient: Admit Type of inpatient treatment program: Adult Patient refused recommended treatment: No Mode of transportation if patient is discharged/movement?: N/A Patient referred to: Other (Comment)([facility])  On Site Evaluation by:   Reviewed with Physician:    Frederich Cha 12/28/2018 10:54 AM

## 2018-12-28 NOTE — Progress Notes (Signed)
Admission Note: From Emergency Room  Janett Billow RN  D: Patient  Currently lives  With son. Diagnosis with Bipolar patient was not accepting of this diagnosis  Patient is not perscribed any medication.  Patient has not slept in 3 days . This is the patient second hospitalization in the past 4-5 years. 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  Pt  denies SI / AVH at this time but staff  Noted patient responding .  Pt is redirectable and cooperative with assessment.     Medical History Anxiety,  Bipolar 1 Arm Surgery    A: Pt admitted to unit per protocol, skin assessment and search done and no contraband found with Lavone Neri RN .  Pt  educated on therapeutic milieu rules. Pt was introduced to milieu by nursing staff.    R: Pt was receptive to education about the milieu .  15 min safety checks started. Probation officer offered support

## 2018-12-28 NOTE — ED Notes (Signed)
Patient laying in bed talking to herself. Speech is clear, however, patient is not appear to be having any concrete thoughts. Patient appreciative of breakfast tray however is not eating at this time.

## 2018-12-28 NOTE — [facility] Suicide Risk Assessment (Signed)
[facility] Admission Suicide Risk Assessment   Nursing information obtained from:    Demographic factors:    Current Mental Status:    Loss Factors:    Historical Factors:    Risk Reduction Factors:     Total Time spent with patient: 1 hour Principal Problem: <principal problem not specified> Diagnosis:  Active Problems:   Acute delirium  Subjective Data: Patient seen chart reviewed.  Attempted to interview patient.  Patient resisted interview.  She appears to be delirious currently and could not be redirected and would not respond to questions.  All the information we have is that she was brought by law enforcement with a petition stating that she had been agitated and aggressive towards her son.  In the emergency room the patient was aggressive towards staff and required forced sedation.  It does not look like any history was obtained so far.  Patient has not been violent to anyone here yet but again is very resistant to redirection.  Continued Clinical Symptoms:  Alcohol Use Disorder Identification Test Final Score (AUDIT): 0 The "Alcohol Use Disorders Identification Test", Guidelines for Use in Primary Care, Second Edition.  World Pharmacologist [facility]). Score between 0-7:  no or low risk or alcohol related problems. Score between 8-15:  moderate risk of alcohol related problems. Score between 16-19:  high risk of alcohol related problems. Score 20 or above:  warrants further diagnostic evaluation for alcohol dependence and treatment.   CLINICAL FACTORS:   Chronic Pain Currently Psychotic   Musculoskeletal: Strength & Muscle Tone: within normal limits Gait & Station: unsteady Patient leans: N/A  Psychiatric Specialty Exam: Physical Exam  Nursing note and vitals reviewed. Constitutional: She appears well-developed and well-nourished.  HENT:  Head: Normocephalic and atraumatic.  Eyes: Pupils are equal, round, and reactive to light. Conjunctivae are normal.  Neck: Normal range  of motion.  Cardiovascular: Regular rhythm and normal heart sounds.  Respiratory: Effort normal. No respiratory distress.  GI: Soft.  Musculoskeletal: Normal range of motion.       Arms:  Neurological: She is alert.  Skin: Skin is warm and dry.  Psychiatric: Her affect is blunt and inappropriate. Her speech is tangential. She is agitated. She is not aggressive. Cognition and memory are impaired. She expresses impulsivity and inappropriate judgment. She expresses no homicidal and no suicidal ideation. She is noncommunicative. She exhibits abnormal recent memory and abnormal remote memory.    Review of Systems  Unable to perform ROS: Psychiatric disorder    Blood pressure (!) 140/100, pulse (!) 109, temperature (!) 97.4 F (36.3 C), temperature source Oral, resp. rate 20, height _0  (1.676 m), weight 80.7 kg, SpO2 100 %.Body mass index is 28.73 kg/m.  General Appearance: Disheveled  Eye Contact:  Poor  Speech:  Garbled and Slow  Volume:  Decreased  Mood:  Anxious  Affect:  Constricted  Thought Process:  Disorganized  Orientation:  Negative  Thought Content:  Rumination and Tangential  Suicidal Thoughts:  No  Homicidal Thoughts:  No  Memory:  Patient shows evidence of confabulating.  When she first met me she told me that she remembered me.  I have never seen her before.  She pointed out several objects on the unit and said that they belonged to her.  Judgement:  Impaired  Insight:  Lacking  Psychomotor Activity:  Decreased, Restlessness and Shuffling Gait  Concentration:  Concentration: Poor  Recall:  Poor  Fund of Knowledge:  Poor  Language:  Poor  Akathisia:  No  Handed:  Right  AIMS (if indicated):     Assets:  Desire for Improvement  ADL's:  Impaired  Cognition:  Impaired,  Mild and Moderate  Sleep:         COGNITIVE FEATURES THAT CONTRIBUTE TO RISK:  None    SUICIDE RISK:   Minimal: No identifiable suicidal ideation.  Patients presenting with no risk factors  but with morbid ruminations; may be classified as minimal risk based on the severity of the depressive symptoms  PLAN OF CARE: Patient was not brought into the hospital because of suicidal concern.  I do not see anything in the chart to suggest any past history of depression or suicidal behavior and right now she is not behaving in a way that would suggest suicidal intention or thought.  She is however very confused and will need a one-to-one sitter for now while we continue to collect information and try to keep her safe.  I certify that inpatient services furnished can reasonably be expected to improve the patient's condition.   Alethia Berthold, MD 12/28/2018, 3:03 PM

## 2018-12-28 NOTE — Plan of Care (Signed)
Pt self isolative in her room. Pt seen laying in bed this shift with the sheets pulled over her head. Pt pulled down the sheets at the request of Probation officer. Pt's speech was incoherant with a lot of missing pieces. Pt stated she got into an argument with her son about "The August and August the lady" that went bad. Pt is not able to elaborate clealy if "August is a lady, the month or a thing". Pt denies SI, HI, but endorses no visual but auditory hallucinations. Pt stated she hears voices , but they are hers and added "never mind, this is going too........" with a blank ending. Pt encouraged to notify writer if any needs, 1:1 sitter at bedside. No distress noted. Will continue to monitor.  Problem: Education: Goal: Knowledge of [facility] Education information/materials will improve Outcome: Progressing Goal: Emotional status will improve Outcome: Progressing Goal: Mental status will improve Outcome: Progressing Goal: Verbalization of understanding the information provided will improve Outcome: Progressing   Problem: Coping: Goal: Ability to verbalize frustrations and anger appropriately will improve Outcome: Progressing Goal: Ability to demonstrate self-control will improve Outcome: Progressing   Problem: Safety: Goal: Periods of time without injury will increase Outcome: Progressing   Problem: Health Behavior/Discharge Planning: Goal: Compliance with prescribed medication regimen will improve Outcome: Progressing   Problem: Nutritional: Goal: Ability to achieve adequate nutritional intake will improve Outcome: Progressing

## 2018-12-28 NOTE — ED Provider Notes (Signed)
-----------------------------------------   5:35 AM on 12/28/2018 -----------------------------------------   Blood pressure 118/64, pulse 93, temperature 99.4 F (37.4 C), temperature source Axillary, resp. rate 20, height 1.727 m (5\' 8" ), weight 79.4 kg, SpO2 95 %.  The patient is calm and cooperative at this time.  There have been no acute events since the last update.  Awaiting disposition plan from Behavioral Medicine and/or Social Work team(s).   Hinda Kehr, MD 12/28/18 [phone]

## 2018-12-28 NOTE — ED Notes (Signed)
Pt given meal tray.

## 2018-12-28 NOTE — [facility] Group Notes (Signed)
LCSW Group Therapy Note  12/28/2018 1:00 PM  Type of Therapy/Topic:  Group Therapy:  Emotion Regulation  Participation Level:  Did Not Attend   Description of Group:   The purpose of this group is to assist patients in learning to regulate negative emotions and experience positive emotions. Patients will be guided to discuss ways in which they have been vulnerable to their negative emotions. These vulnerabilities will be juxtaposed with experiences of positive emotions or situations, and patients will be challenged to use positive emotions to combat negative ones. Special emphasis will be placed on coping with negative emotions in conflict situations, and patients will process healthy conflict resolution skills.  Therapeutic Goals: 1. Patient will identify two positive emotions or experiences to reflect on in order to balance out negative emotions 2. Patient will label two or more emotions that they find the most difficult to experience 3. Patient will demonstrate positive conflict resolution skills through discussion and/or role plays  Summary of Patient Progress: X  Therapeutic Modalities:   Cognitive Behavioral Therapy Feelings Identification Dialectical Behavioral Therapy  Esti Demello, MSW, LCSW 12/28/2018 12:32 PM  

## 2018-12-28 NOTE — H&P (Signed)
Psychiatric Admission Assessment Adult  Patient Identification: Belinda Mcgee MRN:  [id number] Date of Evaluation:  12/28/2018 Chief Complaint:  psychosis Principal Diagnosis: Acute delirium Diagnosis:  Principal Problem:   Acute delirium Active Problems:   Chronic pain syndrome  History of Present Illness: Patient seen chart reviewed.  This is a 58 year old woman who was brought to the emergency room with commitment papers that only state that the patient has been aggressive towards her son.  Commitment papers appear to have been taken out by law enforcement.  I do not see any other information about the circumstances available.  When she came to the emergency room she became agitated and aggressive very rapidly and required forced medication.  No real history was obtained from her in the emergency room.  I have called both of the telephone numbers in the chart listed for her son and 1 of them there was no answer.  On the other 1 I did leave a voicemail message with the telephone number of the unit and my name.  Patient is currently awake but uncooperative.  It was impossible to do an interview with her.  Patient is wandering around the unit and does not respond to verbal redirection.  Talking to her self monitoring at times.  She does engage in some conversation but only to talk about how she has a "mission" and to claim that several things on the unit either belong to her or are things that she made.  She will not answer direct historical questions.  Labs showed that she had hypokalemia on presentation and a few other less notable abnormalities.  Alcohol level was negative no drug screen was obtained.  I checked the controlled substance database and there is no evidence of controlled substance prescribing. Associated Signs/Symptoms: Depression Symptoms:  psychomotor agitation, (Hypo) Manic Symptoms:  Distractibility, Impulsivity, Anxiety Symptoms:  Nonspecific Psychotic Symptoms:  I do not think I  would call her presentation psychotic.  I believe that she has a delirium and possibly a chronic medical issue with her memory PTSD Symptoms: Negative Total Time spent with patient: 1 hour  Past Psychiatric History: The most pertinent clue I could find in her chart is that some of her old records mention that she has an alcohol problem.  This was mentioned on several occasions.  There is no mention of admission specifically for alcohol abuse and no mention of a history of delirium tremens.  Patient's alcohol level was negative on presentation.  She told 1 of the nurses that she did not drink but she is not a reliable historian.  There is mention in the old chart of her having an anxiety disorder.  No notes however from a psychiatric provider.  No clear evidence of having been on psychiatric medicines.  Is the patient at risk to self? Yes.    Has the patient been a risk to self in the past 6 months? Yes.    Has the patient been a risk to self within the distant past? No.  Is the patient a risk to others? Yes.    Has the patient been a risk to others in the past 6 months? No.  Has the patient been a risk to others within the distant past? No.   Prior Inpatient Therapy:   Prior Outpatient Therapy:    Alcohol Screening: 1. How often do you have a drink containing alcohol?: Never 2. How many drinks containing alcohol do you have on a typical day when you are drinking?:  1 or 2 3. How often do you have six or more drinks on one occasion?: Never AUDIT-C Score: 0 4. How often during the last year have you found that you were not able to stop drinking once you had started?: Never 5. How often during the last year have you failed to do what was normally expected from you becasue of drinking?: Never 6. How often during the last year have you needed a first drink in the morning to get yourself going after a heavy drinking session?: Never 7. How often during the last year have you had a feeling of guilt of  remorse after drinking?: Never 8. How often during the last year have you been unable to remember what happened the night before because you had been drinking?: Never 9. Have you or someone else been injured as a result of your drinking?: No 10. Has a relative or friend or a doctor or another health worker been concerned about your drinking or suggested you cut down?: No Alcohol Use Disorder Identification Test Final Score (AUDIT): 0 Alcohol Brief Interventions/Follow-up: AUDIT Score <7 follow-up not indicated, Continued Monitoring Substance Abuse History in the last 12 months:  Yes.   Consequences of Substance Abuse: I put down yes because that is my best guess but I really do not know anything about her history or whether the substance abuse is active.  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  I also suspect that the primary diagnosis right now may be delirium tremens. Previous Psychotropic Medications: No  Psychological Evaluations: No  Past Medical History:  Past Medical History:  Diagnosis Date  . Animal bite   . Anxiety   . Hypertension   . Insomnia     Past Surgical History:  Procedure Laterality Date  . arm surgery     Family History: History reviewed. No pertinent family history. Family Psychiatric  History: No information available Tobacco Screening: Have you used any form of tobacco in the last 30 days? (Cigarettes, Smokeless Tobacco, Cigars, and/or Pipes): No Social History:  Social History   Substance and Sexual Activity  Alcohol Use Yes     Social History   Substance and Sexual Activity  Drug Use Not on file    Additional Social History:                           Allergies:  No Known Allergies Lab Results:  Results for orders placed or performed during the hospital encounter of 12/27/18 (from the past 48 hour(s))  Acetaminophen level     Status:  Abnormal   Collection Time: 12/27/18  8:26 PM  Result Value Ref Range   Acetaminophen (Tylenol), Serum <10 (L) 10 - 30 ug/mL    Comment: (NOTE) Therapeutic concentrations vary significantly. A range of 10-30 ug/mL  may be an effective concentration for many patients. However, some  are best treated at concentrations outside of this range. Acetaminophen concentrations >150 ug/mL at 4 hours after ingestion  and >50 ug/mL at 12 hours after ingestion are often associated with  toxic reactions. Performed at [facility], [address] [place], [place] [address]   Comprehensive metabolic panel     Status: Abnormal   Collection Time: 12/27/18  8:26 PM  Result Value Ref Range   Sodium 142 135 - 145 mmol/L   Potassium 2.9 (L) 3.5 - 5.1  mmol/L   Chloride 107 98 - 111 mmol/L   CO2 20 (L) 22 - 32 mmol/L   Glucose, Bld 112 (H) 70 - 99 mg/dL   BUN 23 (H) 6 - 20 mg/dL   Creatinine, Ser 1.61 (H) 0.44 - 1.00 mg/dL   Calcium 8.8 (L) 8.9 - 10.3 mg/dL   Total Protein 6.9 6.5 - 8.1 g/dL   Albumin 4.0 3.5 - 5.0 g/dL   AST 46 (H) 15 - 41 U/L   ALT 30 0 - 44 U/L   Alkaline Phosphatase 80 38 - 126 U/L   Total Bilirubin 1.0 0.3 - 1.2 mg/dL   GFR calc non Af Amer 59 (L) >60 mL/min   GFR calc Af Amer >60 >60 mL/min   Anion gap 15 5 - 15    Comment: Performed at [facility], [address] [place], [place] [address]  Ethanol     Status: None   Collection Time: 12/27/18  8:26 PM  Result Value Ref Range   Alcohol, Ethyl (B) <10 <10 mg/dL    Comment: (NOTE) Lowest detectable limit for serum alcohol is 10 mg/dL. For medical purposes only. Performed at [facility], [address] [place], [place] [address]   Lipase, blood     Status: None   Collection Time: 12/27/18  8:26 PM  Result Value Ref Range   Lipase 26 11 - 51 U/L    Comment: Performed at [facility], [address] [place], [place] [address]  Salicylate level     Status: None   Collection  Time: 12/27/18  8:26 PM  Result Value Ref Range   Salicylate Lvl <7.0 2.8 - 30.0 mg/dL    Comment: Performed at [facility], [address] [place], [place] [address]  CBC with Differential     Status: Abnormal   Collection Time: 12/27/18  8:26 PM  Result Value Ref Range   WBC 11.3 (H) 4.0 - 10.5 K/uL   RBC 4.02 3.87 - 5.11 MIL/uL   Hemoglobin 11.6 (L) 12.0 - 15.0 g/dL   HCT 62.1 (L) 30.8 - 65.7 %   MCV 87.3 80.0 - 100.0 fL   MCH 28.9 26.0 - 34.0 pg   MCHC 33.0 30.0 - 36.0 g/dL   RDW 84.6 96.2 - 95.2 %   Platelets 325 150 - 400 K/uL   nRBC 0.0 0.0 - 0.2 %   Neutrophils Relative % 80 %   Neutro Abs 8.9 (H) 1.7 - 7.7 K/uL   Lymphocytes Relative 12 %   Lymphs Abs 1.4 0.7 - 4.0 K/uL   Monocytes Relative 8 %   Monocytes Absolute 0.9 0.1 - 1.0 K/uL   Eosinophils Relative 0 %   Eosinophils Absolute 0.0 0.0 - 0.5 K/uL   Basophils Relative 0 %   Basophils Absolute 0.0 0.0 - 0.1 K/uL   Immature Granulocytes 0 %   Abs Immature Granulocytes 0.05 0.00 - 0.07 K/uL    Comment: Performed at [facility], [address], [place], [place] [address]  SARS Coronavirus 2 [facility] order, Performed in [facility] hospital lab) Nasopharyngeal Nasopharyngeal Swab     Status: None   Collection Time: 12/27/18  8:27 PM   Specimen: Nasopharyngeal Swab  Result Value Ref Range   SARS Coronavirus 2 NEGATIVE NEGATIVE    Comment: (NOTE) If result is NEGATIVE SARS-CoV-2 target nucleic acids are NOT DETECTED. The SARS-CoV-2 RNA is generally detectable in upper and lower  respiratory specimens during the acute phase  of infection. The lowest  concentration of SARS-CoV-2 viral copies this assay can detect is 250  copies / mL. A negative result does not preclude SARS-CoV-2 infection  and should not be used as the sole basis for treatment or other  patient management decisions.  A negative result may occur with  improper specimen collection / handling, submission of specimen other  than  nasopharyngeal swab, presence of viral mutation(s) within the  areas targeted by this assay, and inadequate number of viral copies  (<250 copies / mL). A negative result must be combined with clinical  observations, patient history, and epidemiological information. If result is POSITIVE SARS-CoV-2 target nucleic acids are DETECTED. The SARS-CoV-2 RNA is generally detectable in upper and lower  respiratory specimens dur ing the acute phase of infection.  Positive  results are indicative of active infection with SARS-CoV-2.  Clinical  correlation with patient history and other diagnostic information is  necessary to determine patient infection status.  Positive results do  not rule out bacterial infection or co-infection with other viruses. If result is PRESUMPTIVE POSTIVE SARS-CoV-2 nucleic acids MAY BE PRESENT.   A presumptive positive result was obtained on the submitted specimen  and confirmed on repeat testing.  While 2019 novel coronavirus  (SARS-CoV-2) nucleic acids may be present in the submitted sample  additional confirmatory testing may be necessary for epidemiological  and / or clinical management purposes  to differentiate between  SARS-CoV-2 and other Sarbecovirus currently known to infect humans.  If clinically indicated additional testing with an alternate test  methodology [phone]) is advised. The SARS-CoV-2 RNA is generally  detectable in upper and lower respiratory sp ecimens during the acute  phase of infection. The expected result is Negative. Fact Sheet for Patients:  BoilerBrush.com.cy Fact Sheet for Healthcare Providers: https://pope.com/ This test is not yet approved or cleared by the [place] FDA and has been authorized for detection and/or diagnosis of SARS-CoV-2 by FDA under an Emergency Use Authorization (EUA).  This EUA will remain in effect (meaning this test can be used) for the duration of the  COVID-19 declaration under Section 564(b)(1) of the Act, 21 U.S.C. section 360bbb-3(b)(1), unless the authorization is terminated or revoked sooner. Performed at [facility], [address] [place], [place] [address]     Blood Alcohol level:  Lab Results  Component Value Date   [facility] <10 12/27/2018    Metabolic Disorder Labs:  Lab Results  Component Value Date   HGBA1C SEE COMMENT 08/04/2011   No results found for: PROLACTIN Lab Results  Component Value Date   CHOL 118 08/02/2011   TRIG 300 (H) 08/02/2011   HDL 8 (L) 08/02/2011    Current Medications: Current Facility-Administered Medications  Medication Dose Route Frequency Provider Last Rate Last Dose  . acetaminophen (TYLENOL) tablet 650 mg  650 mg Oral Q6H PRN Gillermo Murdoch, NP      . alum & mag hydroxide-simeth (MAALOX/MYLANTA) 200-200-20 MG/5ML suspension 30 mL  30 mL Oral Q4H PRN Gillermo Murdoch, NP      . LORazepam (ATIVAN) tablet 2 mg  2 mg Oral Q6H PRN Akili Cuda, Jackquline Denmark, MD       Or  . LORazepam (ATIVAN) injection 2 mg  2 mg Intramuscular Q6H PRN Brion Hedges T, MD      . magnesium hydroxide (MILK OF MAGNESIA) suspension 30 mL  30 mL Oral Daily PRN Gillermo Murdoch, NP      . magnesium oxide (MAG-OX) tablet 800 mg  800  mg Oral Once Maelys Kinnick T, MD      . potassium chloride SA (K-DUR) CR tablet 40 mEq  40 mEq Oral Once Loie Jahr T, MD      . thiamine (B-1) injection 100 mg  100 mg Intramuscular Once Brandice Busser, Jackquline Denmark, MD      . Melene Muller ON 12/29/2018] thiamine (VITAMIN B-1) tablet 250 mg  250 mg Oral Daily Salem Mastrogiovanni T, MD      . traZODone (DESYREL) tablet 50 mg  50 mg Oral QHS PRN Gillermo Murdoch, NP      . vitamin B-12 (CYANOCOBALAMIN) tablet 1,000 mcg  1,000 mcg Oral Daily Jerric Oyen, Jackquline Denmark, MD       PTA Medications: Medications Prior to Admission  Medication Sig Dispense Refill Last Dose  . Multiple Vitamin (MULTIVITAMIN WITH MINERALS) TABS tablet Take 1 tablet by mouth  daily.       Musculoskeletal: Strength & Muscle Tone: decreased Gait & Station: unsteady Patient leans: N/A  Psychiatric Specialty Exam: Physical Exam  Nursing note and vitals reviewed. Constitutional: She appears well-developed and well-nourished.  HENT:  Head: Normocephalic and atraumatic.  Eyes: Pupils are equal, round, and reactive to light. Conjunctivae are normal.  Neck: Normal range of motion.  Cardiovascular: Regular rhythm and normal heart sounds.  Respiratory: Effort normal.  GI: Soft.  Musculoskeletal: Normal range of motion.        General: Deformity present.       Arms:  Neurological: She is alert.  Patient was not cooperative with examination but her gait would suggest that she may have neuropathy in her feet.  She has a somewhat unsteady shuffling staggered gait.  Skin: Skin is warm and dry.    Review of Systems  Unable to perform ROS: Psychiatric disorder    Blood pressure (!) 140/100, pulse (!) 109, temperature (!) 97.4 F (36.3 C), temperature source Oral, resp. rate 20, height 5\' 6"  (1.676 m), weight 80.7 kg, SpO2 100 %.Body mass index is 28.73 kg/m.  General Appearance: Disheveled  Eye Contact:  Minimal  Speech:  Slow  Volume:  Decreased  Mood:  Anxious  Affect:  Constricted  Thought Process:  Disorganized  Orientation:  Negative  Thought Content:  Illogical, Rumination and Tangential  Suicidal Thoughts:  No  Homicidal Thoughts:  No  Memory:  Negative  Judgement:  Negative  Insight:  Negative  Psychomotor Activity:  Restlessness  Concentration:  Concentration: Poor  Recall:  Poor  Fund of Knowledge:  Poor  Language:  Poor  Akathisia:  Negative  Handed:  Right  AIMS (if indicated):     Assets:  Others:  I honestly do not have enough information to even know what assets she does have  ADL's:  Impaired  Cognition:  Impaired,  Moderate  Sleep:       Treatment Plan Summary: Daily contact with patient to assess and evaluate symptoms and  progress in treatment, Medication management and Plan This is a patient who is quite confused and about whom we have remarkably little information who has been admitted to the psychiatric ward.  Diagnosis in the emergency room was psychosis but I do not think that correctly describes this.  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  Patient is currently going to be difficult to manage.  She seems unsteady on her feet but is not redirectable and is likely to be a fall risk.  Conferred with nursing and we have agreed to order a one-to-one and place her on the back call.  I am ordering that we repeat her basic chemistry panel because of the hypokalemia but in the meantime replacing potassium and magnesium.  Also ordering IM thiamine x1 and daily thiamine and vitamin B12.  As I mentioned I have put in a call to the son and hope to get collateral information soon.  I ordered as needed Ativan for agitation.  She is a little tachycardic and her blood pressure is a little bit elevated but not extremely so right now.  We will have to weigh the risks and benefits of Ativan for confusion versus increasing the risk of falls.  Observation Level/Precautions:  Fall  Laboratory:  Chemistry Profile  Psychotherapy:    Medications:    Consultations:    Discharge Concerns:    Estimated LOS:  Other:     Physician Treatment Plan for Primary Diagnosis: Acute delirium Long Term Goal(s): Improvement in symptoms so as ready for discharge  Short Term Goals: Ability to verbalize feelings will improve and Ability to demonstrate self-control will improve  Physician Treatment Plan for Secondary Diagnosis: Principal Problem:   Acute delirium Active Problems:   Chronic pain syndrome  Long Term Goal(s): Improvement in symptoms so as  ready for discharge  Short Term Goals: Ability to identify and develop effective coping behaviors will improve  I certify that inpatient services furnished can reasonably be expected to improve the patient's condition.    Alethia Berthold, MD 9/2/20203:07 PM

## 2018-12-28 NOTE — BH Assessment (Signed)
Patient is to be admitted to [facility] by Caroline Sauger, NP.  Attending Physician will be Dr. Weber Cooks.   Patient has been assigned to room 310, by [place].   Intake Paper Work has been signed and placed on patient chart.   ER staff is aware of the admission:  Glenda, ER Secretary    Dr. Jimmye Norman, ER MD   Mickel Baas, Patient's Nurse   Elberta Fortis, Patient Access.

## 2018-12-28 NOTE — Tx Team (Addendum)
Initial Treatment Plan 12/28/2018 1:32 PM [name] OVF:[id number]    PATIENT STRESSORS: Psychosis Medication  Non compliant  PATIENT STRENGTHS: Physical Health Supportive family/friends   PATIENT IDENTIFIED PROBLEMS: Psychosis 12/28/2018                     DISCHARGE CRITERIA:  Ability to meet basic life and health needs Improved stabilization in mood, thinking, and/or behavior  PRELIMINARY DISCHARGE PLAN: Outpatient therapy Return to previous living arrangement  PATIENT/FAMILY INVOLVEMENT: This treatment plan has been presented to and reviewed with the patient, Belinda Mcgee, and/or family member,  .  The patient and family have been given the opportunity to ask questions and make suggestions.  Leodis Liverpool, RN 12/28/2018, 1:32 PM

## 2018-12-28 NOTE — ED Notes (Signed)
Reviewed Chart Pt IVC/Per TTS assigned bed BMU 310 when available

## 2018-12-28 NOTE — ED Notes (Signed)
Offered pt a shower and pt stated "a shower sounds good but I don't know if this is the right time let me think about it."

## 2018-12-29 NOTE — Progress Notes (Signed)
[facility]  12/29/2018 12:41 PM Belinda EonCindy L Mcgee  MRN:  [id number] Subjective: Follow-up for this patient brought into the hospital in what appears to me to have been acute delirium.  Patient seen today and also I was able to get her son on the telephone.  In interview the patient seems better than she was yesterday.  She was willing to engage in conversation.  She still did not know where she was and was confused and confabulating about that.  She did know the correct year.  She was not able to give me any clear story about what brought her into the hospital.  She was able to tell me that she lives with her son.  She does not know whether she takes any medications at home regularly.  The patient told me that the last time she had alcohol was about 3 weeks ago but I think her sense of time is not very reliable.  The son tells me that the patient started to have abnormal mental status about 1 week ago.  Things got gradually worse up to the weekend when it was really bad.  Son claims that she only drinks about "6 beers a week".  He denies that she had been drinking any more than that recently.  He denies knowledge of her having any other drug use recently.  No other known new medical problems traumas or stresses.  Repeat labs showed that the potassium is correcting as is the BUN and creatinine.  Still has high blood pressure but otherwise stable.  Patient was able to walk and did not appear to be terribly unstable on her feet although she still has an abnormal gait. Principal Problem: Acute delirium Diagnosis: Principal Problem:   Acute delirium Active Problems:   Chronic pain syndrome  Total Time spent with patient: 30 minutes  Past Psychiatric History: Patient still not able to give much history.  The son tells me that he has been informed by other relatives that the patient was diagnosed with "bipolar disorder" in the 751990s.  He does not know of any prior hospitalizations or any prior medications.   He does not describe anything that sounds like a definite depression or mania.  He does tell me that "for years ago" he brought her into the hospital because she was "catatonic".  Looking through the chart I think he is referring to an incident about 5 years ago when the patient was brought to the hospital with altered mental status which was thought to be related to heavy alcohol abuse.  Past Medical History:  Past Medical History:  Diagnosis Date   Animal bite    Anxiety    Hypertension    Insomnia     Past Surgical History:  Procedure Laterality Date   arm surgery     Family History: History reviewed. No pertinent family history. Family Psychiatric  History: None reported or known. Social History:  Social History   Substance and Sexual Activity  Alcohol Use Yes     Social History   Substance and Sexual Activity  Drug Use Not on file    Social History   Socioeconomic History   Marital status: Single    Spouse name: Not on file   Number of children: Not on file   Years of education: Not on file   Highest education level: Not on file  Occupational History   Not on file  Social Needs   Financial resource strain: Not on file  Food insecurity    Worry: Not on file    Inability: Not on file   Transportation needs    Medical: Not on file    Non-medical: Not on file  Tobacco Use   Smoking status: Former Smoker   Smokeless tobacco: Never Used  Substance and Sexual Activity   Alcohol use: Yes   Drug use: Not on file   Sexual activity: Not on file  Lifestyle   Physical activity    Days per week: Not on file    Minutes per session: Not on file   Stress: Not on file  Relationships   Social connections    Talks on phone: Not on file    Gets together: Not on file    Attends religious service: Not on file    Active member of club or organization: Not on file    Attends meetings of clubs or organizations: Not on file    Relationship status: Not  on file  Other Topics Concern   Not on file  Social History Narrative   Not on file   Additional Social History:                         Sleep: Fair  Appetite:  Fair  Current Medications: Current Facility-Administered Medications  Medication Dose Route Frequency Provider Last Rate Last Dose   acetaminophen (TYLENOL) tablet 650 mg  650 mg Oral Q6H PRN Gillermo Murdochhompson, Jacqueline, NP       alum & mag hydroxide-simeth (MAALOX/MYLANTA) 200-200-20 MG/5ML suspension 30 mL  30 mL Oral Q4H PRN Gillermo Murdochhompson, Jacqueline, NP       LORazepam (ATIVAN) tablet 2 mg  2 mg Oral Q6H PRN Pooja Camuso, Jackquline DenmarkJohn T, MD   2 mg at 12/29/18 16100332   Or   LORazepam (ATIVAN) injection 2 mg  2 mg Intramuscular Q6H PRN Mitchel Delduca, Jackquline DenmarkJohn T, MD       magnesium hydroxide (MILK OF MAGNESIA) suspension 30 mL  30 mL Oral Daily PRN Gillermo Murdochhompson, Jacqueline, NP       thiamine (VITAMIN B-1) tablet 250 mg  250 mg Oral Daily Audre Cenci T, MD   250 mg at 12/29/18 0753   traZODone (DESYREL) tablet 50 mg  50 mg Oral QHS PRN Gillermo Murdochhompson, Jacqueline, NP       vitamin B-12 (CYANOCOBALAMIN) tablet 1,000 mcg  1,000 mcg Oral Daily Willye Javier, Jackquline DenmarkJohn T, MD   1,000 mcg at 12/29/18 0754    Lab Results:  Results for orders placed or performed during the hospital encounter of 12/28/18 (from the past 48 hour(s))  Basic metabolic panel     Status: Abnormal   Collection Time: 12/28/18  3:21 PM  Result Value Ref Range   Sodium 141 135 - 145 mmol/L   Potassium 3.3 (L) 3.5 - 5.1 mmol/L   Chloride 105 98 - 111 mmol/L   CO2 23 22 - 32 mmol/L   Glucose, Bld 105 (H) 70 - 99 mg/dL   BUN 16 6 - 20 mg/dL   Creatinine, Ser 9.600.69 0.44 - 1.00 mg/dL   Calcium 8.9 8.9 - 45.410.3 mg/dL   GFR calc non Af Amer >60 >60 mL/min   GFR calc Af Amer >60 >60 mL/min   Anion gap 13 5 - 15    Comment: Performed at [facility], [address] [place], [place] [address]    Blood Alcohol level:  Lab Results  Component Value Date   [facility] 12/27/2018  Metabolic Disorder Labs: Lab Results  Component Value Date   HGBA1C SEE COMMENT 08/04/2011   No results found for: PROLACTIN Lab Results  Component Value Date   CHOL 118 08/02/2011   TRIG 300 (H) 08/02/2011   HDL 8 (L) 08/02/2011    Physical Findings: AIMS:  , ,  ,  ,    CIWA:    COWS:     Musculoskeletal: Strength & Muscle Tone: decreased Gait & Station: unsteady Patient leans: N/A  Psychiatric Specialty Exam: Physical Exam  Nursing note and vitals reviewed. Constitutional: She appears well-developed and well-nourished.  HENT:  Head: Normocephalic and atraumatic.  Eyes: Pupils are equal, round, and reactive to light. Conjunctivae are normal.  Neck: Normal range of motion.  Cardiovascular: Regular rhythm and normal heart sounds.  Respiratory: Effort normal.  GI: Soft.  Musculoskeletal: Normal range of motion.       Arms:  Neurological: She is alert.  Abnormal gait.  Patient was not a good historian and it was a little hard to tell why.  Still looks to me like she may have proprioception problems.  Skin: Skin is warm and dry.  Psychiatric: Her affect is blunt. Her speech is tangential. She is not agitated and not aggressive. Cognition and memory are impaired. She expresses inappropriate judgment. She expresses no homicidal and no suicidal ideation. She is inattentive.    Review of Systems  Constitutional: Negative.   HENT: Negative.   Eyes: Negative.   Respiratory: Negative.   Cardiovascular: Negative.   Gastrointestinal: Negative.   Musculoskeletal: Negative.   Skin: Negative.   Neurological: Negative.   Psychiatric/Behavioral: Positive for memory loss. Negative for depression, hallucinations, substance abuse and suicidal ideas. The patient is not nervous/anxious and does not have insomnia.     Blood pressure (!) 150/86, pulse (!) 102, temperature 97.7 F (36.5 C), temperature source Oral, resp. rate 16, height 5\' 6"  (1.676 m), weight 80.7 kg, SpO2 100  %.Body mass index is 28.73 kg/m.  General Appearance: Disheveled  Eye Contact:  Minimal  Speech:  Garbled and Slow  Volume:  Decreased  Mood:  Euthymic  Affect:  Constricted  Thought Process:  Disorganized  Orientation:  Negative  Thought Content:  Rumination and Tangential  Suicidal Thoughts:  No  Homicidal Thoughts:  No  Memory:  Immediate;   Fair Recent;   Poor Remote;   Poor  Judgement:  Impaired  Insight:  Shallow  Psychomotor Activity:  Restlessness  Concentration:  Concentration: Poor  Recall:  Poor  Fund of Knowledge:  Fair  Language:  Fair  Akathisia:  No  Handed:  Right  AIMS (if indicated):     Assets:  Desire for Improvement Housing Resilience Social Support  ADL's:  Impaired  Cognition:  Impaired,  Moderate  Sleep:  Number of Hours: 5.3     Treatment Plan Summary: Daily contact with patient to assess and evaluate symptoms and progress in treatment, Medication management and Plan Patient who came into the hospital with what I still think is most likely to have been a delirium.  Still strikes me as more delirious than psychotic today.  Also she has shown improvement without any specific antipsychotic treatment.  Generally getting a little bit better.  The history on getting from the patient and her son does not definitely support my hypothesis of alcohol withdrawal although I am still suspicious that there history may not be completely reliable.  I am going to put off starting any other standing psychiatric medicine at  this point since she is so much better than yesterday and just give it at least another day to see if things continue to improve.  Continue one-to-one for safety because of fall risk at nursing discretion.  Alethia Berthold, MD 12/29/2018, 12:41 PM

## 2018-12-29 NOTE — Progress Notes (Signed)
Recreation Therapy Notes   Date: 12/29/2018  Time: 2:00 pm  Location: Outside  Behavioral response: Appropriate  Group Type: Leisure  Participation level: Active  Communication: Patient was social with peers and staff.  Comments: N/A  [facility] LRT/CTRS        Kellyn Mansfield 12/29/2018 4:12 PM

## 2018-12-29 NOTE — Progress Notes (Signed)
Patient is requiring 1&1 for safety , patient is confused, intrusive and going into other patient's room,  with poor gait requiring constant redirection , fall rist due to poor compliance with fall precautions, 1&1 protocol is  implemented and done for fall preventions.

## 2018-12-29 NOTE — [facility] Counselor (Signed)
Adult Comprehensive Assessment  Patient ID: Belinda Mcgee, female   DOB: December 24, 1960, 58 y.o.   MRN: [id number]  Information Source: Information source: Patient  Current Stressors:  Patient states their primary concerns and needs for treatment are:: "substances, on the road to recovery" Patient states their goals for this hospitilization and ongoing recovery are:: Pt was unable to identify a goal Educational / Learning stressors: high school diploma Employment / Job issues: pt reports she works at [employer] / Lack of resources (include bankruptcy): employment Housing / Lack of housing: Pt reports she has a house Substance abuse: Pt reports she likes to sit back and drink wine sometimes  Living/Environment/Situation:  Living Arrangements: Other (Comment)(Unclear who pt lives with. Pt reported that her sister Belinda Mcgee comes by and Belinda Mcgee comes by and checks on her) Who else lives in the home?: unclear from pts response How long has patient lived in current situation?: "not that long"  Family History:  Marital status: Separated Separated, when?: "a short time" Does patient have children?: Yes How many children?: 1 How is patient's relationship with their children?: pt reports having a son and states their relationship "was great, but then we had a little rift and it got worse"  Childhood History:  By whom was/is the patient raised?: Both parents Description of patient's relationship with caregiver when they were a child: "great" Patient's description of current relationship with people who raised him/her: "really good" Does patient have siblings?: Yes Number of Siblings: 5 Description of patient's current relationship with siblings: "all good relationships" Did patient suffer any verbal/emotional/physical/sexual abuse as a child?: No Did patient suffer from severe childhood neglect?: No Has patient ever been sexually abused/assaulted/raped as an adolescent or adult?: No Was the patient  ever a victim of a crime or a disaster?: No Has patient been effected by domestic violence as an adult?: No  Education:  Highest grade of school patient has completed: 12th grade, high school diploma Currently a student?: No Learning disability?: No  Employment/Work Situation:   Employment situation: Employed Where is patient currently employed?: [employer] How long has patient been employed?: since pt was 58yo What is the longest time patient has a held a job?: 41 years Where was the patient employed at that time?: [employer] Did You Receive Any Psychiatric Treatment/Services While in Passenger transport manager?: No Are There Guns or Other Weapons in [place]?: No Are These Psychologist, educational?: (N/A)  Financial Resources:   Financial resources: Income from employment, Medicare Does patient have a representative payee or guardian?: No  Alcohol/Substance Abuse:   What has been your use of drugs/alcohol within the last 12 months?: Pt reports drinking wine occasionally If attempted suicide, did drugs/alcohol play a role in this?: No Alcohol/Substance Abuse Treatment Hx: Denies past history Has alcohol/substance abuse ever caused legal problems?: (Pt reports she may have a court date from what she was told, but CSW does not see pt on the court calendar.)  Social Support System:   Patient's Community Support System: Fair Describe Community Support System: Sister Belinda Mcgee, and friend Belinda Mcgee Type of faith/religion: Pt unable to identify  Leisure/Recreation:   Leisure and Hobbies: "shoot the basketball"  Strengths/Needs:   What is the patient's perception of their strengths?: "shoot a basketball" Patient states they can use these personal strengths during their treatment to contribute to their recovery: pt unable to answer  Discharge Plan:   Currently receiving community mental health services: No Patient states concerns and preferences for aftercare planning are: Pt  agreeable to referral to [facility] Patient  states they will know when they are safe and ready for discharge when: "I know when I felt good and can walk around" Does patient have access to transportation?: Yes Does patient have financial barriers related to discharge medications?: No Will patient be returning to same living situation after discharge?: Yes  Summary/Recommendations:   Summary and Recommendations (to be completed by the evaluator): Pt is a 58 yo female living [place], [place] [facility] Pt presents to the hospital due to agression. Pt has a diagnosis of Acute Delirium. Pt seems confused at time of assessment and was unable to answer all questions appropriately. Pt reported that she has a hard time remembering things now. Pt reports being separated, having 1 son, employed at [employer], has [employer] Medicare, and agreeable to referral to [facility]. Pt unable to identify who all lives in her home at this time. Recommendations for pt include: crisis stabilization, therapeutic milieu, encourage group attendance and participation, medication management for mood stabilization, and development for comprehensive mental wellness plan. CSW assessing for appropriate referrals.  Charlann LangeOlivia K Arlie Riker MSW LCSW 12/29/2018 9:58 AM

## 2018-12-29 NOTE — Progress Notes (Signed)
0623am: Pt awake and OOB to the bathroom. 1: 1 sitter at bedside. Pt states she wants to go take care of herself in the bathroom. Will continue to monitor.

## 2018-12-29 NOTE — Progress Notes (Signed)
1:1 Continuously 4-7 PM 1600 Patient  Outside sitting with peers appetite good at dinner. Gait remains unsteady 1700 Patient sitting in dayroom  Reading a magazine , conversation with  Her peers  1800 sitting in dayroom  With peers  Voice no concerns  1900 Patient remains in dayroom  Sitting and conversing with peers

## 2018-12-29 NOTE — Progress Notes (Addendum)
Continuous 1:1 7:45-11:00 AM 7:45 Writer escorted  Patient to dayroom for  Breakfast . Appetite good . Gait unsteady  To dayroom  Patient remained disorganized and confused  And unfocused .  Noted to count tissue  Paper  0800 Patient escorted to dayroom  . Sitting in chair  Conversation minimal unreleated.  0900 Patient sitting in chair in dayroom . Wanting to remain in  Chair  Eyes closed   1000 Writer escorted  Patient to room  Able to  Urinate in commode  Washed hands on command  From writer 1030 Patient met  With Dr. Clapacs  At this time  1100 Patient resting in bed eyes closed  Snoring  

## 2018-12-29 NOTE — Progress Notes (Signed)
1900-2300: Pt asleep on her bed with the sheets pulled over her. Pt was briefly awakened when this Probation officer completed nursing assessment. Pt went back to sleep. 0000-0300: Pt asleep and woke up about 0300. Pt proceeded to the bathroom to wash her hair.

## 2018-12-29 NOTE — Tx Team (Addendum)
Interdisciplinary Treatment and Diagnostic Plan Update  12/29/2018 Time of Session: 900AM Belinda Mcgee MRN: [id number]  Principal Diagnosis: Acute delirium  Secondary Diagnoses: Principal Problem:   Acute delirium Active Problems:   Chronic pain syndrome   Current Medications:  Current Facility-Administered Medications  Medication Dose Route Frequency Provider Last Rate Last Dose  . acetaminophen (TYLENOL) tablet 650 mg  650 mg Oral Q6H PRN Caroline Sauger, NP      . alum & mag hydroxide-simeth (MAALOX/MYLANTA) 200-200-20 MG/5ML suspension 30 mL  30 mL Oral Q4H PRN Caroline Sauger, NP      . LORazepam (ATIVAN) tablet 2 mg  2 mg Oral Q6H PRN Clapacs, Madie Reno, MD   2 mg at 12/29/18 1517   Or  . LORazepam (ATIVAN) injection 2 mg  2 mg Intramuscular Q6H PRN Clapacs, John T, MD      . magnesium hydroxide (MILK OF MAGNESIA) suspension 30 mL  30 mL Oral Daily PRN Caroline Sauger, NP      . thiamine (VITAMIN B-1) tablet 250 mg  250 mg Oral Daily Clapacs, Madie Reno, MD   250 mg at 12/29/18 0753  . traZODone (DESYREL) tablet 50 mg  50 mg Oral QHS PRN Caroline Sauger, NP      . vitamin B-12 (CYANOCOBALAMIN) tablet 1,000 mcg  1,000 mcg Oral Daily Clapacs, Madie Reno, MD   1,000 mcg at 12/29/18 0754   PTA Medications: Medications Prior to Admission  Medication Sig Dispense Refill Last Dose  . Multiple Vitamin (MULTIVITAMIN WITH MINERALS) TABS tablet Take 1 tablet by mouth daily.       Patient Stressors:    Patient Strengths: Physical Health Supportive family/friends  Treatment Modalities: Medication Management, Group therapy, Case management,  1 to 1 session with clinician, Psychoeducation, Recreational therapy.   Physician Treatment Plan for Primary Diagnosis: Acute delirium Long Term Goal(s): Improvement in symptoms so as ready for discharge Improvement in symptoms so as ready for discharge   Short Term Goals: Ability to verbalize feelings will improve Ability to  demonstrate self-control will improve Ability to identify and develop effective coping behaviors will improve  Medication Management: Evaluate patient's response, side effects, and tolerance of medication regimen.  Therapeutic Interventions: 1 to 1 sessions, Unit Group sessions and Medication administration.  Evaluation of Outcomes: Not Met  Physician Treatment Plan for Secondary Diagnosis: Principal Problem:   Acute delirium Active Problems:   Chronic pain syndrome  Long Term Goal(s): Improvement in symptoms so as ready for discharge Improvement in symptoms so as ready for discharge   Short Term Goals: Ability to verbalize feelings will improve Ability to demonstrate self-control will improve Ability to identify and develop effective coping behaviors will improve     Medication Management: Evaluate patient's response, side effects, and tolerance of medication regimen.  Therapeutic Interventions: 1 to 1 sessions, Unit Group sessions and Medication administration.  Evaluation of Outcomes: Not Met   RN Treatment Plan for Primary Diagnosis: Acute delirium Long Term Goal(s): Knowledge of disease and therapeutic regimen to maintain health will improve  Short Term Goals: Ability to demonstrate self-control, Ability to participate in decision making will improve and Compliance with prescribed medications will improve  Medication Management: RN will administer medications as ordered by provider, will assess and evaluate patient's response and provide education to patient for prescribed medication. RN will report any adverse and/or side effects to prescribing provider.  Therapeutic Interventions: 1 on 1 counseling sessions, Psychoeducation, Medication administration, Evaluate responses to treatment, Monitor vital signs and CBGs  as ordered, Perform/monitor CIWA, COWS, AIMS and Fall Risk screenings as ordered, Perform wound care treatments as ordered.  Evaluation of Outcomes: Not  Met   LCSW Treatment Plan for Primary Diagnosis: Acute delirium Long Term Goal(s): Safe transition to appropriate next level of care at discharge, Engage patient in therapeutic group addressing interpersonal concerns.  Short Term Goals: Engage patient in aftercare planning with referrals and resources, Facilitate acceptance of mental health diagnosis and concerns, Identify triggers associated with mental health/substance abuse issues and Increase skills for wellness and recovery  Therapeutic Interventions: Assess for all discharge needs, 1 to 1 time with Social worker, Explore available resources and support systems, Assess for adequacy in community support network, Educate family and significant other(s) on suicide prevention, Complete Psychosocial Assessment, Interpersonal group therapy.  Evaluation of Outcomes: Not Met   Progress in Treatment: Attending groups: No. Participating in groups: No. Taking medication as prescribed: Yes. Toleration medication: Yes. Family/Significant other contact made: No, will contact:  when pt gives consent Patient understands diagnosis: No. Discussing patient identified problems/goals with staff: No. Medical problems stabilized or resolved: No. Denies suicidal/homicidal ideation: Yes. Issues/concerns per patient self-inventory: No. Other: N/A  New problem(s) identified: No, Describe:  none  New Short Term/Long Term Goal(s): Detox, elimination of AVH/symptoms of psychosis, medication management for mood stabilization; elimination of SI thoughts; development of comprehensive mental wellness/sobriety plan.   Patient Goals:  Pt unable to attend treatment team and provide a goal at this time  Discharge Plan or Barriers: SPE pamphlet, Mobile Crisis information, and AA/NA information provided to patient for additional community support and resources. Referral to [facility] will be sent. CSW assessing for appropriate referrals.  Reason for Continuation of  Hospitalization: Aggression Medical Issues Medication stabilization Other; describe delirium  Estimated Length of Stay: 5-7days  Recreational Therapy: Patient Stressors: N/A  Patient Goal: Patient will engage in groups without prompting or encouragement from LRT x3 group sessions within 5 recreation therapy group sessions  Attendees: Patient: Belinda Mcgee 12/29/2018 9:59 AM  Physician: Dr Weber Cooks MD 12/29/2018 9:59 AM  Nursing: West Pugh RN 12/29/2018 9:59 AM  RN Care Manager: 12/29/2018 9:59 AM  Social Worker: Minette Brine Moton LCSW 12/29/2018 9:59 AM  Recreational Therapist: Roanna Epley CTRS LRT 12/29/2018 9:59 AM  Other: Assunta Curtis LCSW 12/29/2018 9:59 AM  Other: Sanjuana Kava LCSW 12/29/2018 9:59 AM  Other: 12/29/2018 9:59 AM    Scribe for Treatment Team: Mariann Laster Moton, LCSW 12/29/2018 9:59 AM

## 2018-12-29 NOTE — Progress Notes (Signed)
Continuous 1:1 12-3 PM 12 Patient sitting in dayroom  With peers . Patient appetite good at lunch 1300 Patient remains in dayroom sitting in chair  eyes closed  1400 Patient in room  Showering  1500 Patient remains in room putting  lotion on body

## 2018-12-29 NOTE — Progress Notes (Signed)
Recreation Therapy Notes   Date: 12/29/2018  Time: 9:30 am   Location: Craft room   Behavioral response: N/A   Intervention Topic: Problem-Solving  Discussion/Intervention: Patient did not attend group.   Clinical Observations/Feedback:  Patient did not attend group.   Nickoles Gregori LRT/CTRS         Luetta Piazza 12/29/2018 11:39 AM

## 2018-12-29 NOTE — Plan of Care (Signed)
Patient  confused at present time unable to focus or fully comprehend   Problem: Education: Goal: Knowledge of [place] [facility] Education information/materials will improve Outcome: Not Progressing Goal: Emotional status will improve Outcome: Not Progressing Goal: Mental status will improve Outcome: Not Progressing Goal: Verbalization of understanding the information provided will improve Outcome: Not Progressing   Problem: Coping: Goal: Ability to verbalize frustrations and anger appropriately will improve Outcome: Not Progressing Goal: Ability to demonstrate self-control will improve Outcome: Not Progressing   Problem: Safety: Goal: Periods of time without injury will increase Outcome: Not Progressing   Problem: Health Behavior/Discharge Planning: Goal: Compliance with prescribed medication regimen will improve Outcome: Not Progressing   Problem: Nutritional: Goal: Ability to achieve adequate nutritional intake will improve Outcome: Not Progressing

## 2018-12-29 NOTE — BHH Group Notes (Signed)
Balance In Life 12/29/2018 1PM  Type of Therapy/Topic:  Group Therapy:  Balance in Life  Participation Level:  Did Not Attend  Description of Group:   This group will address the concept of balance and how it feels and looks when one is unbalanced. Patients will be encouraged to process areas in their lives that are out of balance and identify reasons for remaining unbalanced. Facilitators will guide patients in utilizing problem-solving interventions to address and correct the stressor making their life unbalanced. Understanding and applying boundaries will be explored and addressed for obtaining and maintaining a balanced life. Patients will be encouraged to explore ways to assertively make their unbalanced needs known to significant others in their lives, using other group members and facilitator for support and feedback.  Therapeutic Goals: 1. Patient will identify two or more emotions or situations they have that consume much of in their lives. 2. Patient will identify signs/triggers that life has become out of balance:  3. Patient will identify two ways to set boundaries in order to achieve balance in their lives:  4. Patient will demonstrate ability to communicate their needs through discussion and/or role plays  Summary of Patient Progress:    Therapeutic Modalities:   Cognitive Behavioral Therapy Solution-Focused Therapy Assertiveness Training  Kaycie Pegues T Montanna Mcbain, LCSW  

## 2018-12-29 NOTE — Progress Notes (Signed)
0330am: Pt up in bathroom washing and combing her hair. Gait unsteady, thoughts tangential and content incoherent. Pt stayed on this task for an unusual amount of time and appeared to be lost on the next action with some signs of anxiety. Prn ativan given by mouth for anxiety . Pt was encouraged to return to bed and she agreed, stating "yeah, I need to do that because I feel tired". Safety sitter 1:1 at bedside.   0432am: Anxiety medication effective, Pt asleep.

## 2018-12-29 NOTE — Plan of Care (Signed)
  Problem: Education: Goal: Knowledge of  General Education information/materials will improve Outcome: Progressing Goal: Emotional status will improve Outcome: Progressing Goal: Mental status will improve Outcome: Progressing Goal: Verbalization of understanding the information provided will improve Outcome: Progressing   Problem: Coping: Goal: Ability to verbalize frustrations and anger appropriately will improve Outcome: Progressing Goal: Ability to demonstrate self-control will improve Outcome: Progressing   Problem: Safety: Goal: Periods of time without injury will increase Outcome: Progressing   Problem: Health Behavior/Discharge Planning: Goal: Compliance with prescribed medication regimen will improve Outcome: Progressing   Problem: Nutritional: Goal: Ability to achieve adequate nutritional intake will improve Outcome: Progressing

## 2018-12-29 NOTE — BHH Suicide Risk Assessment (Signed)
[place] INPATIENT:  Family/Significant Other Suicide Prevention Education  Suicide Prevention Education:  Contact Attempts: Amy Edmondson or Kirtland Bouchard  has been identified by the patient as the family member/significant other with whom the patient will be residing, and identified as the person(s) who will aid the patient in the event of a mental health crisis.  With written consent from the patient, two attempts were made to provide suicide prevention education, prior to and/or following the patient's discharge.  We were unsuccessful in providing suicide prevention education.  A suicide education pamphlet was given to the patient to share with family/significant other.  Date and time of first attempt: 12/29/2018 at 9:44AM Date and time of second attempt:  CSW left a HIPAA compliant voicemail asking for a callback. CSW unsure if the number belongs to Amy Edmondson or Kirtland Bouchard as pt cited the same number for both people. CSW will try the number again at a later date.  Lynn MSW LCSW 12/29/2018, 9:43 AM

## 2018-12-30 NOTE — Progress Notes (Signed)
Pt rated depression and anxiety both at 5/10. Pt denies SI. HI and AVH. Pt was educated on care plan this morning and has shown improvement in mental state . Collier Bullock RN

## 2018-12-30 NOTE — Progress Notes (Signed)
Recreation Therapy Notes  INPATIENT RECREATION THERAPY ASSESSMENT  Patient Details Name: Belinda Mcgee MRN: [id number] DOB: [birth year] Today's Date: 12/30/2018       Information Obtained From: Patient  Able to Participate in Assessment/Interview: Yes  Patient Presentation: Responsive, Confused  Reason for Admission (Per Patient): Active Symptoms  Patient Stressors:    Coping Skills:   Music  Leisure Interests (2+):  Sports - Basketball, Social - Family  Frequency of Recreation/Participation: Monthly  Awareness of Community Resources:     [employer]:     Current Use:    If no, Barriers?:    Expressed Interest in [employer] Information:    [place] of Residence:  Insurance underwriter  Patient Main Form of Transportation:    Patient Strengths:  N/A  Patient Identified Areas of Improvement:  Walking more  Patient Goal for Hospitalization:  N/A  Current SI (including self-harm):  No  Current HI:  No  Current AVH: No  Staff Intervention Plan: Group Attendance, Collaborate with Interdisciplinary Treatment Team  Consent to Intern Participation: N/A  Hallel Denherder 12/30/2018, 1:43 PM

## 2018-12-30 NOTE — BHH Group Notes (Signed)
LCSW Group Therapy Note  12/30/2018 9:59 AM  Type of Therapy and Topic:  Group Therapy:  Feelings around Relapse and Recovery  Participation Level:  Minimal   Description of Group:    Patients in this group will discuss emotions they experience before and after a relapse. They will process how experiencing these feelings, or avoidance of experiencing them, relates to having a relapse. Facilitator will guide patients to explore emotions they have related to recovery. Patients will be encouraged to process which emotions are more powerful. They will be guided to discuss the emotional reaction significant others in their lives may have to their relapse or recovery. Patients will be assisted in exploring ways to respond to the emotions of others without this contributing to a relapse.  Therapeutic Goals: 1. Patient will identify two or more emotions that lead to a relapse for them 2. Patient will identify two emotions that result when they relapse 3. Patient will identify two emotions related to recovery 4. Patient will demonstrate ability to communicate their needs through discussion and/or role plays   Summary of Patient Progress: Pt was present in group and identified a relapse as "falling back". Pt seems to be unable to keep up with the pace of the group, but was participating to the best of her ability. Pt reported feeling like she may have Alzheimer's and reported that her mother had it. Pt reported feeling like everything was moving fast and she could not keep up.     Therapeutic Modalities:   Cognitive Behavioral Therapy Solution-Focused Therapy Assertiveness Training Relapse Prevention Therapy   Evalina Field, MSW, LCSW Clinical Social Work 12/30/2018 9:59 AM

## 2018-12-30 NOTE — Progress Notes (Signed)
Recreation Therapy Notes    Date: 12/30/2018   Time: 9:30 am   Location: Craft room   Behavioral response: N/A   Intervention Topic: Communication   Discussion/Intervention: Patient did not attend group.   Clinical Observations/Feedback:  Patient did not attend group.   Nikkol Pai LRT/CTRS          Dereon Williamsen 12/30/2018 10:50 AM

## 2018-12-30 NOTE — BHH Suicide Risk Assessment (Signed)
[place] INPATIENT:  Family/Significant Other Suicide Prevention Education  Suicide Prevention Education:  Contact Attempts: Amy Loni Beckwith or Kirtland Bouchard [phone]  has been identified by the patient as the family member/significant other with whom the patient will be residing, and identified as the person(s) who will aid the patient in the event of a mental health crisis.  With written consent from the patient, two attempts were made to provide suicide prevention education, prior to and/or following the patient's discharge.  We were unsuccessful in providing suicide prevention education.  A suicide education pamphlet was given to the patient to share with family/significant other.  Date and time of first attempt: 12/29/2018 at 9:44AM Date and time of second attempt: 12/30/2018 at 9:20AM  CSW left a HIPAA compliant voicemail asking for a callback. CSW unsure if the number belongs to Amy Edmondson or Kirtland Bouchard as pt cited the same number for both people.   [place] MSW LCSW 12/30/2018, 9:20 AM

## 2018-12-30 NOTE — Progress Notes (Signed)
[facility] MD Progress Note  12/30/2018 4:26 PM Belinda Mcgee  MRN:  [id number] Subjective: Follow-up patient admitted with delirious confusion.  On interview today the patient seems quite a bit better.  She is able to answer questions rationally and calmly.  She is oriented to her current situation.  She still does not have any clear explanation for what brought her into the hospital and minimizes her drinking.  Vital signs are stable.  She is more stable on her feet.  Denies any mood symptoms.  Denies suicidal or homicidal ideation. Principal Problem: Acute delirium Diagnosis: Principal Problem:   Acute delirium Active Problems:   Chronic pain syndrome  Total Time spent with patient: 30 minutes  Past Psychiatric History: Past history of alcohol abuse  Past Medical History:  Past Medical History:  Diagnosis Date  . Animal bite   . Anxiety   . Hypertension   . Insomnia     Past Surgical History:  Procedure Laterality Date  . arm surgery     Family History: History reviewed. No pertinent family history. Family Psychiatric  History: See previous Social History:  Social History   Substance and Sexual Activity  Alcohol Use Yes     Social History   Substance and Sexual Activity  Drug Use Not on file    Social History   Socioeconomic History  . Marital status: Single    Spouse name: Not on file  . Number of children: Not on file  . Years of education: Not on file  . Highest education level: Not on file  Occupational History  . Not on file  Social Needs  . Financial resource strain: Not on file  . Food insecurity    Worry: Not on file    Inability: Not on file  . Transportation needs    Medical: Not on file    Non-medical: Not on file  Tobacco Use  . Smoking status: Former Games developer  . Smokeless tobacco: Never Used  Substance and Sexual Activity  . Alcohol use: Yes  . Drug use: Not on file  . Sexual activity: Not on file  Lifestyle  . Physical activity    Days per  week: Not on file    Minutes per session: Not on file  . Stress: Not on file  Relationships  . Social Musician on phone: Not on file    Gets together: Not on file    Attends religious service: Not on file    Active member of club or organization: Not on file    Attends meetings of clubs or organizations: Not on file    Relationship status: Not on file  Other Topics Concern  . Not on file  Social History Narrative  . Not on file   Additional Social History:                         Sleep: Fair  Appetite:  Fair  Current Medications: Current Facility-Administered Medications  Medication Dose Route Frequency Provider Last Rate Last Dose  . acetaminophen (TYLENOL) tablet 650 mg  650 mg Oral Q6H PRN Gillermo Murdoch, NP      . alum & mag hydroxide-simeth (MAALOX/MYLANTA) 200-200-20 MG/5ML suspension 30 mL  30 mL Oral Q4H PRN Gillermo Murdoch, NP      . LORazepam (ATIVAN) tablet 2 mg  2 mg Oral Q6H PRN Clapacs, Jackquline Denmark, MD   2 mg at 12/29/18 4650   Or  .  LORazepam (ATIVAN) injection 2 mg  2 mg Intramuscular Q6H PRN Clapacs, John T, MD      . magnesium hydroxide (MILK OF MAGNESIA) suspension 30 mL  30 mL Oral Daily PRN Caroline Sauger, NP      . thiamine (VITAMIN B-1) tablet 250 mg  250 mg Oral Daily Clapacs, Madie Reno, MD   250 mg at 12/30/18 0811  . traZODone (DESYREL) tablet 50 mg  50 mg Oral QHS PRN Caroline Sauger, NP   50 mg at 12/30/18 0047  . vitamin B-12 (CYANOCOBALAMIN) tablet 1,000 mcg  1,000 mcg Oral Daily Clapacs, Madie Reno, MD   1,000 mcg at 12/30/18 7829    Lab Results: No results found for this or any previous visit (from the past 48 hour(s)).  Blood Alcohol level:  Lab Results  Component Value Date   ETH <10 56/21/3086    Metabolic Disorder Labs: Lab Results  Component Value Date   HGBA1C SEE COMMENT 08/04/2011   No results found for: PROLACTIN Lab Results  Component Value Date   CHOL 118 08/02/2011   TRIG 300 (H)  08/02/2011   HDL 8 (L) 08/02/2011    Physical Findings: AIMS:  , ,  ,  ,    CIWA:    COWS:     Musculoskeletal: Strength & Muscle Tone: within normal limits Gait & Station: normal Patient leans: N/A  Psychiatric Specialty Exam: Physical Exam  Nursing note and vitals reviewed. Constitutional: She appears well-developed and well-nourished.  HENT:  Head: Normocephalic and atraumatic.  Eyes: Pupils are equal, round, and reactive to light. Conjunctivae are normal.  Neck: Normal range of motion.  Cardiovascular: Normal heart sounds.  Respiratory: Effort normal.  GI: Soft.  Musculoskeletal: Normal range of motion.  Neurological: She is alert.  Skin: Skin is warm and dry.  Psychiatric: Her affect is blunt. Her speech is delayed. She is slowed. Cognition and memory are impaired. She expresses impulsivity. She expresses no homicidal and no suicidal ideation.    Review of Systems  Constitutional: Negative.   HENT: Negative.   Eyes: Negative.   Respiratory: Negative.   Cardiovascular: Negative.   Gastrointestinal: Negative.   Musculoskeletal: Negative.   Skin: Negative.   Neurological: Negative.   Psychiatric/Behavioral: Positive for memory loss. Negative for depression, hallucinations, substance abuse and suicidal ideas. The patient is nervous/anxious. The patient does not have insomnia.     Blood pressure (!) 149/86, pulse 95, temperature 97.7 F (36.5 C), temperature source Oral, resp. rate 17, height 5\' 6"  (1.676 m), weight 80.7 kg, SpO2 98 %.Body mass index is 28.73 kg/m.  General Appearance: Casual  Eye Contact:  Fair  Speech:  Clear and Coherent  Volume:  Normal  Mood:  Euthymic  Affect:  Constricted  Thought Process:  Goal Directed  Orientation:  Full (Time, Place, and Person)  Thought Content:  Logical  Suicidal Thoughts:  No  Homicidal Thoughts:  No  Memory:  Immediate;   Fair Recent;   Fair Remote;   Fair  Judgement:  Fair  Insight:  Fair  Psychomotor  Activity:  Decreased  Concentration:  Concentration: Fair  Recall:  [employer] of Knowledge:  Fair  Language:  Fair  Akathisia:  No  Handed:  Right  AIMS (if indicated):     Assets:  Desire for Improvement  ADL's:  Intact  Cognition:  Impaired,  Mild  Sleep:  Number of Hours: 7.15     Treatment Plan Summary: Daily contact with patient to assess and  evaluate symptoms and progress in treatment, Medication management and Plan Better today although still confused at times.  Looks dazed.  Not really taking care of her hygiene very well yet.  Nevertheless she is able to eat and go to the bathroom and certainly does not need a one-to-one anymore.  Stable on her feet.  I continue to believe that we are looking at withdrawal symptoms from alcohol and if so she is coming through this pretty well.  No change to any medication treatment at this point.  Mordecai RasmussenJohn Clapacs, MD 12/30/2018, 4:26 PM

## 2018-12-31 NOTE — Progress Notes (Signed)
Patient alert and oriented x 4 no distress noted thoughts are organized and coherent, she isolates to self , affect is blunted, she is receptive to staff, no distress noted, she denies SI/HI/AVH. 15 minutes safety chocks maintained will continue to monitor

## 2018-12-31 NOTE — Progress Notes (Signed)
[facility] MD Progress Note  12/31/2018 12:34 PM Belinda Mcgee  MRN:  [id number] Subjective: Follow-up for this patient who is admitted with delirium.  Every day she is looking better.  Today she is alert and oriented to the year and month although she remains a little confused about where she is and has no memory of how she wound up here.  She has not shown signs of delirium or agitation during the day but seems to have ongoing memory impairment.  She cannot give me a description of what her life at home recently has been like and has no memory of what brought her to the hospital.  When I suggested to her that this could be alcohol withdrawal she seemed confused and became ruminative talking vaguely about alcohol in the past but could not give me clear answers about recent behavior.  Vitals currently stable.  Certainly no suicidal or homicidal ideation.  Self-care is still a little impaired but getting better. Principal Problem: Acute delirium Diagnosis: Principal Problem:   Acute delirium Active Problems:   Chronic pain syndrome  Total Time spent with patient: 30 minutes  Past Psychiatric History: Past history of altered mental status which seems to have probably been related to alcohol abuse  Past Medical History:  Past Medical History:  Diagnosis Date  . Animal bite   . Anxiety   . Hypertension   . Insomnia     Past Surgical History:  Procedure Laterality Date  . arm surgery     Family History: History reviewed. No pertinent family history. Family Psychiatric  History: See previous Social History:  Social History   Substance and Sexual Activity  Alcohol Use Yes     Social History   Substance and Sexual Activity  Drug Use Not on file    Social History   Socioeconomic History  . Marital status: Single    Spouse name: Not on file  . Number of children: Not on file  . Years of education: Not on file  . Highest education level: Not on file  Occupational History  . Not on file   Social Needs  . Financial resource strain: Not on file  . Food insecurity    Worry: Not on file    Inability: Not on file  . Transportation needs    Medical: Not on file    Non-medical: Not on file  Tobacco Use  . Smoking status: Former Research scientist (life sciences)  . Smokeless tobacco: Never Used  Substance and Sexual Activity  . Alcohol use: Yes  . Drug use: Not on file  . Sexual activity: Not on file  Lifestyle  . Physical activity    Days per week: Not on file    Minutes per session: Not on file  . Stress: Not on file  Relationships  . Social Herbalist on phone: Not on file    Gets together: Not on file    Attends religious service: Not on file    Active member of club or organization: Not on file    Attends meetings of clubs or organizations: Not on file    Relationship status: Not on file  Other Topics Concern  . Not on file  Social History Narrative  . Not on file   Additional Social History:                         Sleep: Fair  Appetite:  Fair  Current Medications: Current Facility-Administered  Medications  Medication Dose Route Frequency Provider Last Rate Last Dose  . acetaminophen (TYLENOL) tablet 650 mg  650 mg Oral Q6H PRN Gillermo Murdoch, NP      . alum & mag hydroxide-simeth (MAALOX/MYLANTA) 200-200-20 MG/5ML suspension 30 mL  30 mL Oral Q4H PRN Gillermo Murdoch, NP      . LORazepam (ATIVAN) tablet 2 mg  2 mg Oral Q6H PRN Clapacs, Jackquline Denmark, MD   2 mg at 12/29/18 3704   Or  . LORazepam (ATIVAN) injection 2 mg  2 mg Intramuscular Q6H PRN Clapacs, John T, MD      . magnesium hydroxide (MILK OF MAGNESIA) suspension 30 mL  30 mL Oral Daily PRN Gillermo Murdoch, NP      . thiamine (VITAMIN B-1) tablet 250 mg  250 mg Oral Daily Clapacs, Jackquline Denmark, MD   250 mg at 12/31/18 0724  . traZODone (DESYREL) tablet 50 mg  50 mg Oral QHS PRN Gillermo Murdoch, NP   50 mg at 12/30/18 0047  . vitamin B-12 (CYANOCOBALAMIN) tablet 1,000 mcg  1,000 mcg Oral  Daily Clapacs, Jackquline Denmark, MD   1,000 mcg at 12/31/18 8889    Lab Results: No results found for this or any previous visit (from the past 48 hour(s)).  Blood Alcohol level:  Lab Results  Component Value Date   ETH <10 12/27/2018    Metabolic Disorder Labs: Lab Results  Component Value Date   HGBA1C SEE COMMENT 08/04/2011   No results found for: PROLACTIN Lab Results  Component Value Date   CHOL 118 08/02/2011   TRIG 300 (H) 08/02/2011   HDL 8 (L) 08/02/2011    Physical Findings: AIMS:  , ,  ,  ,    CIWA:    COWS:     Musculoskeletal: Strength & Muscle Tone: within normal limits Gait & Station: normal Patient leans: N/A  Psychiatric Specialty Exam: Physical Exam  Nursing note and vitals reviewed. Constitutional: She appears well-developed and well-nourished.  HENT:  Head: Normocephalic and atraumatic.  Eyes: Pupils are equal, round, and reactive to light. Conjunctivae are normal.  Neck: Normal range of motion.  Cardiovascular: Regular rhythm and normal heart sounds.  Respiratory: Effort normal. No respiratory distress.  GI: Soft.  Musculoskeletal: Normal range of motion.  Neurological: She is alert.  Skin: Skin is warm and dry.  Psychiatric: Her affect is blunt. Her speech is delayed. She is slowed. She expresses no homicidal and no suicidal ideation. She exhibits abnormal recent memory and abnormal remote memory.    Review of Systems  Constitutional: Negative.   HENT: Negative.   Eyes: Negative.   Respiratory: Negative.   Cardiovascular: Negative.   Gastrointestinal: Negative.   Musculoskeletal: Negative.   Skin: Negative.   Neurological: Negative.   Psychiatric/Behavioral: Positive for memory loss. Negative for depression, hallucinations, substance abuse and suicidal ideas. The patient is nervous/anxious.     Blood pressure (!) 149/92, pulse (!) 102, temperature 98.1 F (36.7 C), temperature source Oral, resp. rate 18, height 5\' 6"  (1.676 m), weight 80.7  kg, SpO2 100 %.Body mass index is 28.73 kg/m.  General Appearance: Casual  Eye Contact:  Fair  Speech:  Slow  Volume:  Decreased  Mood:  Euthymic  Affect:  Constricted and Tearful  Thought Process:  Disorganized  Orientation:  Other:  Oriented to the year and month and to herself but not really to the situation very well.  Thought Content:  Rumination and Tangential  Suicidal Thoughts:  No  Homicidal  Thoughts:  No  Memory:  Immediate;   Fair Recent;   Poor Remote;   Fair  Judgement:  Fair  Insight:  Shallow  Psychomotor Activity:  Decreased  Concentration:  Concentration: Poor  Recall:  Poor  Fund of Knowledge:  Fair  Language:  Fair  Akathisia:  No  Handed:  Right  AIMS (if indicated):     Assets:  Desire for Improvement Physical Health Social Support  ADL's:  Impaired  Cognition:  Impaired,  Mild  Sleep:  Number of Hours: 7.5     Treatment Plan Summary: Daily contact with patient to assess and evaluate symptoms and progress in treatment, Medication management and Plan Although we still do not have a concrete description of recent alcohol use her clinical course is playing out pretty much as I expected.  The delirium is calming down but she continues to show signs of short-term memory impairment which I suspect also are probably caused by chronic alcohol use.  Dangerousness is subsiding.  She still is going to be limited right now in her self-care.  We will need to follow-up with her son.  If she continues to show stabilization might be ready for discharge in a couple days.  No indication for any other psychiatric medicine.  Mordecai RasmussenJohn Clapacs, MD 12/31/2018, 12:34 PM

## 2018-12-31 NOTE — Plan of Care (Signed)
  Problem: Health Behavior/Discharge Planning: Goal: Compliance with prescribed medication regimen will improve Outcome: Progressing  Patient compliant with med's.

## 2018-12-31 NOTE — [facility] Group Notes (Signed)
LCSW Group Therapy Note  12/31/2018  1:00 PM   Type of Therapy and Topic:  Group Therapy:  Trust and Honesty   Participation Level:  Active   Description of Group:    In this group patients will be asked to explore the value of being honest.  Patients will be guided to discuss their thoughts, feelings, and behaviors related to honesty and trusting in others. Patients will process together how trust and honesty relate to forming relationships with peers, family members, and self. Each patient will be challenged to identify and express feelings of being vulnerable. Patients will discuss reasons why people are dishonest and identify alternative outcomes if one was truthful (to self or others). This group will be process-oriented, with patients participating in exploration of their own experiences, giving and receiving support, and processing challenge from other group members.   Therapeutic Goals: 1. Patient will identify why honesty is important to relationships and how honesty overall affects relationships.  2. Patient will identify a situation where they lied or were lied too and the  feelings, thought process, and behaviors surrounding the situation 3. Patient will identify the meaning of being vulnerable, how that feels, and how that correlates to being honest with self and others. 4. Patient will identify situations where they could have told the truth, but instead lied and explain reasons of dishonesty.   Summary of Patient Progress:  Patient was present and attentive in group.  Patient shared how trust and honesty "makes you feel better" in a relationship.  She shared how one of the benefits of being trustworthy and honest is allows for validity in a relationship.     Therapeutic Modalities:   Cognitive Behavioral Therapy Solution Focused Therapy Motivational Interviewing Brief Therapy  Assunta Curtis, MSW, LCSW 12/31/2018 2:26 PM

## 2018-12-31 NOTE — Plan of Care (Signed)
Patient denies SI/HI/AVH. Patient is bizarre at time and has to be redirected. Patient has short term memory loss and requires attention by staff for unit orientation. Patient is in milieu and is adherent with scheduled medications.    Problem: Education: Goal: Knowledge of [place] [facility] Education information/materials will improve Outcome: Not Progressing Goal: Emotional status will improve Outcome: Not Progressing Goal: Mental status will improve Outcome: Not Progressing

## 2019-01-01 NOTE — Progress Notes (Signed)
[facility] MD Progress Note  01/01/2019 5:42 PM Belinda Mcgee  MRN:  [id number] Subjective: Follow-up for patient who was brought in with delirium which I suspect was related to alcohol withdrawal.  Patient is no longer appearing delirious.  She is appropriately interactive throughout the day.  Alert and oriented.  Able to engage in appropriate conversation.  I think her understanding of her life at home and her recent memory are still significantly impaired and this is a issue that she has only partial insight into.  However she is not showing any current dangerous behavior not aggressive and mood appears to be upbeat.  Physically doing well. Principal Problem: Acute delirium Diagnosis: Principal Problem:   Acute delirium Active Problems:   Chronic pain syndrome  Total Time spent with patient: 30 minutes  Past Psychiatric History: Past history of alcohol abuse  Past Medical History:  Past Medical History:  Diagnosis Date  . Animal bite   . Anxiety   . Hypertension   . Insomnia     Past Surgical History:  Procedure Laterality Date  . arm surgery     Family History: History reviewed. No pertinent family history. Family Psychiatric  History: See previous Social History:  Social History   Substance and Sexual Activity  Alcohol Use Yes     Social History   Substance and Sexual Activity  Drug Use Not on file    Social History   Socioeconomic History  . Marital status: Single    Spouse name: Not on file  . Number of children: Not on file  . Years of education: Not on file  . Highest education level: Not on file  Occupational History  . Not on file  Social Needs  . Financial resource strain: Not on file  . Food insecurity    Worry: Not on file    Inability: Not on file  . Transportation needs    Medical: Not on file    Non-medical: Not on file  Tobacco Use  . Smoking status: Former Games developer  . Smokeless tobacco: Never Used  Substance and Sexual Activity  . Alcohol use: Yes   . Drug use: Not on file  . Sexual activity: Not on file  Lifestyle  . Physical activity    Days per week: Not on file    Minutes per session: Not on file  . Stress: Not on file  Relationships  . Social Musician on phone: Not on file    Gets together: Not on file    Attends religious service: Not on file    Active member of club or organization: Not on file    Attends meetings of clubs or organizations: Not on file    Relationship status: Not on file  Other Topics Concern  . Not on file  Social History Narrative  . Not on file   Additional Social History:                         Sleep: Fair  Appetite:  Fair  Current Medications: Current Facility-Administered Medications  Medication Dose Route Frequency Provider Last Rate Last Dose  . acetaminophen (TYLENOL) tablet 650 mg  650 mg Oral Q6H PRN Gillermo Murdoch, NP      . alum & mag hydroxide-simeth (MAALOX/MYLANTA) 200-200-20 MG/5ML suspension 30 mL  30 mL Oral Q4H PRN Gillermo Murdoch, NP      . LORazepam (ATIVAN) tablet 2 mg  2 mg Oral Q6H  PRN , Madie Reno, MD   2 mg at 12/29/18 1443   Or  . LORazepam (ATIVAN) injection 2 mg  2 mg Intramuscular Q6H PRN ,  T, MD      . magnesium hydroxide (MILK OF MAGNESIA) suspension 30 mL  30 mL Oral Daily PRN Caroline Sauger, NP      . thiamine (VITAMIN B-1) tablet 250 mg  250 mg Oral Daily , Madie Reno, MD   250 mg at 01/01/19 0849  . traZODone (DESYREL) tablet 50 mg  50 mg Oral QHS PRN Caroline Sauger, NP   50 mg at 12/30/18 0047  . vitamin B-12 (CYANOCOBALAMIN) tablet 1,000 mcg  1,000 mcg Oral Daily , Madie Reno, MD   1,000 mcg at 01/01/19 1540    Lab Results: No results found for this or any previous visit (from the past 48 hour(s)).  Blood Alcohol level:  Lab Results  Component Value Date   ETH <10 08/67/6195    Metabolic Disorder Labs: Lab Results  Component Value Date   HGBA1C SEE COMMENT 08/04/2011   No  results found for: PROLACTIN Lab Results  Component Value Date   CHOL 118 08/02/2011   TRIG 300 (H) 08/02/2011   HDL 8 (L) 08/02/2011    Physical Findings: AIMS:  , ,  ,  ,    CIWA:    COWS:     Musculoskeletal: Strength & Muscle Tone: within normal limits Gait & Station: normal Patient leans: N/A  Psychiatric Specialty Exam: Physical Exam  Nursing note and vitals reviewed. Constitutional: She appears well-developed and well-nourished.  HENT:  Head: Normocephalic and atraumatic.  Eyes: Pupils are equal, round, and reactive to light. Conjunctivae are normal.  Neck: Normal range of motion.  Cardiovascular: Normal heart sounds.  Respiratory: Effort normal.  GI: Soft.  Musculoskeletal: Normal range of motion.  Neurological: She is alert.  Skin: Skin is warm and dry.  Psychiatric: She has a normal mood and affect. Her speech is normal and behavior is normal. She expresses impulsivity. She expresses no homicidal and no suicidal ideation. She exhibits abnormal recent memory.    Review of Systems  Constitutional: Negative.   HENT: Negative.   Eyes: Negative.   Respiratory: Negative.   Cardiovascular: Negative.   Gastrointestinal: Negative.   Musculoskeletal: Negative.   Skin: Negative.   Neurological: Negative.   Psychiatric/Behavioral: Positive for memory loss. Negative for depression, hallucinations, substance abuse and suicidal ideas. The patient is nervous/anxious. The patient does not have insomnia.     Blood pressure (!) 120/91, pulse 97, temperature 97.8 F (36.6 C), temperature source Oral, resp. rate 18, height 5\' 6"  (1.676 m), weight 80.7 kg, SpO2 99 %.Body mass index is 28.73 kg/m.  General Appearance: Casual  Eye Contact:  Fair  Speech:  Clear and Coherent  Volume:  Normal  Mood:  Euthymic  Affect:  Congruent  Thought Process:  Coherent  Orientation:  Full (Time, Place, and Person)  Thought Content:  Rumination  Suicidal Thoughts:  No  Homicidal  Thoughts:  No  Memory:  Immediate;   Fair Recent;   Poor Remote;   Fair  Judgement:  Impaired  Insight:  Shallow  Psychomotor Activity:  Decreased  Concentration:  Concentration: Fair  Recall:  Poor  Fund of Knowledge:  Fair  Language:  Fair  Akathisia:  No  Handed:  Right  AIMS (if indicated):     Assets:  Desire for Improvement Housing Physical Health Resilience  ADL's:  Impaired  Cognition:  WNL  Sleep:  Number of Hours: 1.1     Treatment Plan Summary: Daily contact with patient to assess and evaluate symptoms and progress in treatment, Medication management and Plan Generally doing better.  Still recorded is not sleeping much at night but she is up most of the day.  She does not however appear to be manic or agitated.  I have a feeling that she is likely to settle out as havin chronic memory impairment and I told her as much.  Nevertheless she may be reaching her baseline.  We will look at possible discharge 1 to 2 days no change in medicine for now.  Mordecai RasmussenJohn , MD 01/01/2019, 5:42 PM

## 2019-01-01 NOTE — Progress Notes (Signed)
Patient alert and oriented x 2 with periods of confusion to place and situation. Patient's affect is flat, no initiation of conversation, she was noted pacing the unit earlier in shift and she was frequently refirected for safety, patient's thoughts are disorganized and tangential at times, speech is soft non pressured, she is receptive to staff, no distress noted, she denies SI/HI/AVH. 15 minutes safety checks maintained will continue to monitor

## 2019-01-01 NOTE — Plan of Care (Signed)
D- Patient alert and oriented. Patient presents in a pleasant mood on assessment stating that she slept "50/50" last night reporting that "I don't know, but I was apprehensive about something". Patient endorsed depression/anxiety on her self-inventory, rating them a "2/10" and "3/10", however, she only reported depression to this Probation officer. Patient stated "I get a little sad, missing friends and family". Patient denies SI, HI, AVH, and pain at this time. Patient's goal for today is to "get my belongings organized, plan my future", in which she will "start getting stuff together neatly, start writing my plan" in order to accomplish her goal.  A- Scheduled medications administered to patient, per MD orders. Support and encouragement provided.  Routine safety checks conducted every 15 minutes.  Patient informed to notify staff with problems or concerns.  R- No adverse drug reactions noted. Patient contracts for safety at this time. Patient compliant with medications and treatment plan. Patient receptive, calm, and cooperative. Patient interacts well with others on the unit.  Patient remains safe at this time.  Problem: Education: Goal: Knowledge of [place] [facility] Education information/materials will improve Outcome: Progressing Goal: Emotional status will improve Outcome: Progressing Goal: Mental status will improve Outcome: Progressing Goal: Verbalization of understanding the information provided will improve Outcome: Progressing   Problem: Coping: Goal: Ability to verbalize frustrations and anger appropriately will improve Outcome: Progressing Goal: Ability to demonstrate self-control will improve Outcome: Progressing   Problem: Safety: Goal: Periods of time without injury will increase Outcome: Progressing   Problem: Health Behavior/Discharge Planning: Goal: Compliance with prescribed medication regimen will improve Outcome: Progressing   Problem: Nutritional: Goal: Ability to  achieve adequate nutritional intake will improve Outcome: Progressing

## 2019-01-01 NOTE — [facility] Group Notes (Signed)
LCSW Group Therapy Note  01/01/2019 11:37 AM   Type of Therapy and Topic: Group Therapy: Feelings Around Returning Home & Establishing a Supportive Framework and Supporting Oneself When Supports Not Available   Participation Level:  Did Not Attend   Description of Group:  Patients first processed thoughts and feelings about upcoming discharge. These included fears of upcoming changes, lack of change, new living environments, judgements and expectations from others and overall stigma of mental health issues. The group then discussed the definition of a supportive framework, what that looks and feels like, and how do to discern it from an unhealthy non-supportive network. The group identified different types of supports as well as what to do when your family/friends are less than helpful or unavailable   Therapeutic Goals  1. Patient will identify one healthy supportive network that they can use at discharge. 2. Patient will identify one factor of a supportive framework and how to tell it from an unhealthy network. 3. Patient able to identify one coping skill to use when they do not have positive supports from others. 4. Patient will demonstrate ability to communicate their needs through discussion and/or role plays.   Summary of Patient Progress:  x    Therapeutic Modalities Cognitive Behavioral Therapy Motivational Interviewing    Aminah Zabawa, MSW, LCSW Clinical Social Work 01/01/2019 11:37 AM   

## 2019-01-01 NOTE — Plan of Care (Signed)
  Problem: Health Behavior/Discharge Planning: Goal: Compliance with prescribed medication regimen will improve Outcome: Progressing  Patient is compliant with prescribed food regimen.

## 2019-01-01 NOTE — Progress Notes (Signed)
Patient has being appropriate and maintaining safety, this writer  has noted some periods of forgetfulness by this patient , had asked if she can go up stairs to get coffee , when in fact the coffee machine is right there in front of her, patient was  re-oriented to her environment and the coffee machine, patient denies any Suicidal or homicidal ideations , visual and auditory hallucinations can not be  Determined  At this time , patient is walking very slow and cautious, patient denies any physical complains , 15 minutes safety rounding is maintained no distress.

## 2019-01-02 MED ORDER — CYANOCOBALAMIN 1000 MCG PO TABS: 1000.0000 ug | ORAL_TABLET | Freq: Every day | ORAL | 2 refills | Status: AC

## 2019-01-02 MED ORDER — THIAMINE HCL 250 MG PO TABS: 250.0000 mg | ORAL_TABLET | Freq: Every day | ORAL | 1 refills | Status: AC

## 2019-01-02 MED ORDER — TRAZODONE HCL 50 MG PO TABS: 50.0000 mg | ORAL_TABLET | Freq: Every evening | ORAL | 1 refills | Status: AC | PRN

## 2019-01-02 NOTE — BHH Suicide Risk Assessment (Signed)
[facility] Discharge Suicide Risk Assessment   Principal Problem: Acute delirium Discharge Diagnoses: Principal Problem:   Acute delirium Active Problems:   Chronic pain syndrome   Total Time spent with patient: 30 minutes  Musculoskeletal: Strength & Muscle Tone: within normal limits Gait & Station: normal Patient leans: N/A  Psychiatric Specialty Exam: Review of Systems  Constitutional: Negative.   HENT: Negative.   Eyes: Negative.   Respiratory: Negative.   Cardiovascular: Negative.   Gastrointestinal: Negative.   Musculoskeletal: Negative.   Skin: Negative.   Neurological: Negative.   Psychiatric/Behavioral: Positive for memory loss. Negative for depression, hallucinations, substance abuse and suicidal ideas. The patient is not nervous/anxious and does not have insomnia.     Blood pressure 135/84, pulse (!) 101, temperature 97.6 F (36.4 C), temperature source Oral, resp. rate 18, height 5\' 6"  (1.676 m), weight 80.7 kg, SpO2 100 %.Body mass index is 28.73 kg/m.  General Appearance: Fairly Groomed  Engineer, water::  Good  Speech:  Clear and [id number]  Volume:  Normal  Mood:  Euthymic  Affect:  Constricted  Thought Process:  Goal Directed  Orientation:  Full (Time, Place, and Person)  Thought Content:  Logical  Suicidal Thoughts:  No  Homicidal Thoughts:  No  Memory:  Immediate;   Fair Recent;   Poor Remote;   Fair  Judgement:  Fair  Insight:  Fair  Psychomotor Activity:  Normal  Concentration:  Fair  Recall:  [employer]  Language: Fair  Akathisia:  No  Handed:  Right  AIMS (if indicated):     Assets:  Desire for Improvement Housing Physical Health Social Support  Sleep:  Number of Hours: 3.5  Cognition: Impaired,  Mild  ADL's:  Intact   Mental Status Per Nursing Assessment::   On Admission:  NA  Demographic Factors:  Caucasian and Unemployed  Loss Factors: Decline in physical health  Historical Factors: NA  Risk Reduction  Factors:   Religious beliefs about death, Living with another person, especially a relative and Positive therapeutic relationship  Continued Clinical Symptoms:  Alcohol/Substance Abuse/Dependencies  Cognitive Features That Contribute To Risk:  Loss of executive function    Suicide Risk:  Minimal: No identifiable suicidal ideation.  Patients presenting with no risk factors but with morbid ruminations; may be classified as minimal risk based on the severity of the depressive symptoms  Follow-up Information    [facility] Follow up.   Specialty: Behavioral Health Why: A referral has been sent to [facility].  Please contact them once you have been discharged for an appointment.  Contact information: [place] [phone]          Plan Of Care/Follow-up recommendations:  Activity:  Activity as tolerated Diet:  Regular diet Other:  Follow-up with outpatient care as directed.  Strongly encourage patient not to drink alcohol anymore.  Alethia Berthold, MD 01/02/2019, 10:23 AM

## 2019-01-02 NOTE — Discharge Summary (Signed)
Physician Discharge Summary Note  Patient:  Belinda Mcgee is an 58 y.o., female MRN:  [id number] DOB:  1960-09-05 Patient phone:  [phone] (home)  Patient address:   [place] [address],  Total Time spent with patient: 30 minutes  Date of Admission:  12/28/2018 Date of Discharge: January 02, 2019  Reason for Admission: Admitted through the emergency room where she presented with agitation confusion and delirium  Principal Problem: Acute delirium Discharge Diagnoses: Principal Problem:   Acute delirium Active Problems:   Chronic pain syndrome   Past Psychiatric History: Past history of alcohol abuse  Past Medical History:  Past Medical History:  Diagnosis Date  . Animal bite   . Anxiety   . Hypertension   . Insomnia     Past Surgical History:  Procedure Laterality Date  . arm surgery     Family History: History reviewed. No pertinent family history. Family Psychiatric  History: None reported Social History:  Social History   Substance and Sexual Activity  Alcohol Use Yes     Social History   Substance and Sexual Activity  Drug Use Not on file    Social History   Socioeconomic History  . Marital status: Single    Spouse name: Not on file  . Number of children: Not on file  . Years of education: Not on file  . Highest education level: Not on file  Occupational History  . Not on file  Social Needs  . Financial resource strain: Not on file  . Food insecurity    Worry: Not on file    Inability: Not on file  . Transportation needs    Medical: Not on file    Non-medical: Not on file  Tobacco Use  . Smoking status: Former Research scientist (life sciences)  . Smokeless tobacco: Never Used  Substance and Sexual Activity  . Alcohol use: Yes  . Drug use: Not on file  . Sexual activity: Not on file  Lifestyle  . Physical activity    Days per week: Not on file    Minutes per session: Not on file  . Stress: Not on file  Relationships  . Social Product manager on phone: Not on file    Gets together: Not on file    Attends religious service: Not on file    Active member of club or organization: Not on file    Attends meetings of clubs or organizations: Not on file    Relationship status: Not on file  Other Topics Concern  . Not on file  Social History Narrative  . Not on file    Hospital Course: Patient was admitted to the psychiatric ward where she was initially noted to be delirious and somewhat agitated.  Required one-to-one monitoring and frequent dosing with medication for alcohol withdrawal.  Did not have any seizure activity.  Did not have any suicidal thoughts expressed or behaviors expressed.  Cooperated with treatment.  Patient is no longer delirious.  She is alert and oriented and able to interact appropriately.  Denies any mood symptoms.  She still has some impairment in short-term memory which may be a lasting issue.  I have advised the patient of the importance of not drinking alcohol at all in the future.  No indication for any psychiatric medicine specifically at this time.  Discharge with follow-up recommended as needed.  Physical Findings: AIMS:  , ,  ,  ,    CIWA:    COWS:  Musculoskeletal: Strength & Muscle Tone: within normal limits Gait & Station: normal Patient leans: N/A  Psychiatric Specialty Exam: Physical Exam  Nursing note and vitals reviewed. Constitutional: She appears well-developed and well-nourished.  HENT:  Head: Normocephalic and atraumatic.  Eyes: Pupils are equal, round, and reactive to light. Conjunctivae are normal.  Neck: Normal range of motion.  Cardiovascular: Regular rhythm and normal heart sounds.  Respiratory: Effort normal.  GI: Soft.  Musculoskeletal: Normal range of motion.  Neurological: She is alert.  Skin: Skin is warm and dry.  Psychiatric: She has a normal mood and affect. Her behavior is normal. Judgment and thought content normal. She exhibits abnormal recent memory.     Review of Systems  Constitutional: Negative.   HENT: Negative.   Eyes: Negative.   Respiratory: Negative.   Cardiovascular: Negative.   Gastrointestinal: Negative.   Musculoskeletal: Negative.   Skin: Negative.   Neurological: Negative.   Psychiatric/Behavioral: Negative.     Blood pressure 135/84, pulse (!) 101, temperature 97.6 F (36.4 C), temperature source Oral, resp. rate 18, height 5\' 6"  (1.676 m), weight 80.7 kg, SpO2 100 %.Body mass index is 28.73 kg/m.  General Appearance: Casual  Eye Contact:  Good  Speech:  Clear and Coherent  Volume:  Normal  Mood:  Euthymic  Affect:  Congruent  Thought Process:  Goal Directed  Orientation:  Full (Time, Place, and Person)  Thought Content:  Logical  Suicidal Thoughts:  No  Homicidal Thoughts:  No  Memory:  Immediate;   Fair Recent;   Fair Remote;   Fair  Judgement:  Fair  Insight:  Fair  Psychomotor Activity:  Decreased  Concentration:  Concentration: Fair  Recall:  [employer]  Fund of Knowledge:  Fair  Language:  Fair  Akathisia:  No  Handed:  Right  AIMS (if indicated):     Assets:  Desire for Improvement Housing  ADL's:  Intact  Cognition:  WNL  Sleep:  Number of Hours: 3.5     Have you used any form of tobacco in the last 30 days? (Cigarettes, Smokeless Tobacco, Cigars, and/or Pipes): No  Has this patient used any form of tobacco in the last 30 days? (Cigarettes, Smokeless Tobacco, Cigars, and/or Pipes) Yes, No  Blood Alcohol level:  Lab Results  Component Value Date   ETH <10 12/27/2018    Metabolic Disorder Labs:  Lab Results  Component Value Date   HGBA1C SEE COMMENT 08/04/2011   No results found for: PROLACTIN Lab Results  Component Value Date   CHOL 118 08/02/2011   TRIG 300 (H) 08/02/2011   HDL 8 (L) 08/02/2011    See Psychiatric Specialty Exam and Suicide Risk Assessment completed by Attending Physician prior to discharge.  Discharge destination:  Home  Is patient on multiple antipsychotic  therapies at discharge:  No   Has Patient had three or more failed trials of antipsychotic monotherapy by history:  No  Recommended Plan for Multiple Antipsychotic Therapies: NA  Discharge Instructions    Diet - low sodium heart healthy   Complete by: As directed    Increase activity slowly   Complete by: As directed      Allergies as of 01/02/2019   No Known Allergies     Medication List    STOP taking these medications   multivitamin with minerals Tabs tablet     TAKE these medications     Indication  cyanocobalamin 1000 MCG tablet Take 1 tablet (1,000 mcg total) by mouth daily. Start taking  on: January 03, 2019  Indication: Inadequate Vitamin B12   thiamine 250 MG tablet Take 1 tablet (250 mg total) by mouth daily. Start taking on: January 03, 2019  Indication: Deficiency of Vitamin B1   traZODone 50 MG tablet Commonly known as: DESYREL Take 1 tablet (50 mg total) by mouth at bedtime as needed for sleep.  Indication: Trouble Sleeping      Follow-up Information    [facility] Follow up.   Specialty: Behavioral Health Why: A referral has been sent to [facility].  Please contact them once you have been discharged for an appointment.  Contact information: [address] [facility] [place] [address] [phone]          Follow-up recommendations:  Activity:  Activity as tolerated Diet:  Regular diet Other:  Follow-up with outpatient care as directed.  Do not drink alcohol.  Comments: Patient has been counseled about the dangers of continued alcohol abuse.  She has a safe place to live evidently.  Discharged on as needed trazodone and vitamins only.  Signed: Mordecai Rasmussen, MD 01/02/2019, 10:31 AM

## 2019-01-02 NOTE — Progress Notes (Signed)
Recreation Therapy Notes  INPATIENT RECREATION TR PLAN  Patient Details Name: Belinda Mcgee MRN: [id number] DOB: October 02, 1960 Today's Date: 01/02/2019  Rec Therapy Plan Is patient appropriate for Therapeutic Recreation?: Yes Treatment times per week: at least 3 Estimated Length of Stay: 5-7 days TR Treatment/Interventions: Group participation (Comment)  Discharge Criteria Pt will be discharged from therapy if:: Discharged Treatment plan/goals/alternatives discussed and agreed upon by:: Patient/family  Discharge Summary Short term goals set: Patient will engage in groups without prompting or encouragement from LRT x3 group sessions within 5 recreation therapy group sessions Short term goals met: Not met Reason goals not met: Patient did not attend any groups Therapeutic equipment acquired: N/A Reason patient discharged from therapy: Discharge from hospital Pt/family agrees with progress & goals achieved: Yes Date patient discharged from therapy: 01/02/19   Riaz Onorato 01/02/2019, 11:23 AM

## 2019-01-02 NOTE — Progress Notes (Signed)
  [facility] Adult Case Management Discharge Plan :  Will you be returning to the same living situation after discharge:  Yes,  home At discharge, do you have transportation home?: Yes,  son will pick pt up Do you have the ability to pay for your medications: Yes,  Medicare  Release of information consent forms completed and in the chart;    Patient to Follow up at: Follow-up Information    [facility] Follow up.   Specialty: Behavioral Health Why: A referral has been sent to [facility].  Please contact them once you have been discharged for an appointment.  Contact information: [place] [phone]          Next level of care provider has access to [place] and Suicide Prevention discussed: Yes,  SPE completed with pt  Have you used any form of tobacco in the last 30 days? (Cigarettes, Smokeless Tobacco, Cigars, and/or Pipes): No  Has patient been referred to the Quitline?: N/A patient is not a smoker  Patient has been referred for addiction treatment: Pt. refused referral  Delfin Edis, LCSW 01/02/2019, 11:28 AM

## 2019-01-02 NOTE — Progress Notes (Signed)
Patient ID: Belinda Mcgee, female   DOB: [birth year], 59 y.o.   MRN: [id number]   Discharge Note:  Patient denies SI/HI/AVH at this time. Patient discharge instructions, AVS, prescriptions, and transition record gone over with patient. Patient agrees to comply with medication management, follow-up visit, and outpatient therapy. Patient belongings returned to patient. Patient questions and concerns addressed and answered. Patient ambulatory off unit. Patient discharged to home with son.

## 2019-01-02 NOTE — Plan of Care (Signed)
  Problem: Group Participation Goal: STG - Patient will engage in groups without prompting or encouragement from LRT x3 group sessions within 5 recreation therapy group sessions Description: STG - Patient will engage in groups without prompting or encouragement from LRT x3 group sessions within 5 recreation therapy group sessions 01/02/2019 1122 by Ernest Haber, LRT Outcome: Not Applicable 12/03/6771 [id number] by Ernest Haber, LRT Outcome: Not Met (add Reason) Note: Patient did not attend any groups

## 2019-01-02 NOTE — Progress Notes (Signed)
Recreation Therapy Notes   Date: 01/02/2019  Time: 9:30 am   Location: Craft room   Behavioral response: N/A   Intervention Topic: Decision making  Discussion/Intervention: Patient did not attend group.   Clinical Observations/Feedback:  Patient did not attend group.   [name] LRT/CTRS         [name] 01/02/2019 11:01 AM

## 2019-03-06 ENCOUNTER — Ambulatory Visit: Payer: Medicare Other | Admitting: Psychiatry

## 2019-03-06 ENCOUNTER — Other Ambulatory Visit: Payer: Self-pay
